# Patient Record
Sex: Female | Born: 1990 | Race: Black or African American | Hispanic: No | Marital: Single | Smoking: Current every day smoker
Health system: Southern US, Community
[De-identification: ages and names within clinical notes are randomized; demographics above are authoritative.]

## PROBLEM LIST (undated history)

## (undated) DIAGNOSIS — D649 Anemia, unspecified: Secondary | ICD-10-CM

## (undated) DIAGNOSIS — N920 Excessive and frequent menstruation with regular cycle: Secondary | ICD-10-CM

## (undated) DIAGNOSIS — D509 Iron deficiency anemia, unspecified: Secondary | ICD-10-CM

## (undated) DIAGNOSIS — E278 Other specified disorders of adrenal gland: Secondary | ICD-10-CM

## (undated) HISTORY — DX: Excessive and frequent menstruation with regular cycle: N92.0

## (undated) HISTORY — DX: Iron deficiency anemia, unspecified: D50.9

---
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
## 2010-05-20 ENCOUNTER — Observation Stay: Payer: Self-pay | Admitting: Obstetrics and Gynecology

## 2010-10-04 ENCOUNTER — Inpatient Hospital Stay: Payer: Self-pay | Admitting: Obstetrics and Gynecology

## 2011-11-20 ENCOUNTER — Emergency Department: Payer: Self-pay | Admitting: *Deleted

## 2016-01-03 ENCOUNTER — Encounter: Payer: Self-pay | Admitting: Emergency Medicine

## 2016-01-03 ENCOUNTER — Emergency Department
Admission: EM | Admit: 2016-01-03 | Discharge: 2016-01-03 | Discharge status: Against Medical Advice | Payer: Self-pay | Attending: Emergency Medicine | Admitting: Emergency Medicine

## 2016-01-03 DIAGNOSIS — Z3202 Encounter for pregnancy test, result negative: Secondary | ICD-10-CM | POA: Insufficient documentation

## 2016-01-03 DIAGNOSIS — F17210 Nicotine dependence, cigarettes, uncomplicated: Secondary | ICD-10-CM | POA: Insufficient documentation

## 2016-01-03 DIAGNOSIS — N939 Abnormal uterine and vaginal bleeding, unspecified: Secondary | ICD-10-CM | POA: Insufficient documentation

## 2016-01-03 DIAGNOSIS — R1031 Right lower quadrant pain: Secondary | ICD-10-CM

## 2016-01-03 LAB — URINALYSIS COMPLETE WITH MICROSCOPIC (ARMC ONLY)
Bacteria, UA: NONE SEEN
Bilirubin Urine: NEGATIVE
GLUCOSE, UA: NEGATIVE mg/dL
Ketones, ur: NEGATIVE mg/dL
Leukocytes, UA: NEGATIVE
Nitrite: NEGATIVE
PH: 5 (ref 5.0–8.0)
Protein, ur: 30 mg/dL — AB
Specific Gravity, Urine: 1.029 (ref 1.005–1.030)

## 2016-01-03 LAB — COMPREHENSIVE METABOLIC PANEL
ALT: 10 U/L — AB (ref 14–54)
AST: 13 U/L — AB (ref 15–41)
Albumin: 4.1 g/dL (ref 3.5–5.0)
Alkaline Phosphatase: 60 U/L (ref 38–126)
Anion gap: 7 (ref 5–15)
BUN: 9 mg/dL (ref 6–20)
CHLORIDE: 105 mmol/L (ref 101–111)
CO2: 23 mmol/L (ref 22–32)
CREATININE: 0.69 mg/dL (ref 0.44–1.00)
Calcium: 9.1 mg/dL (ref 8.9–10.3)
GFR calc Af Amer: >60 mL/min (ref >60)
GLUCOSE: 110 mg/dL — AB (ref 65–99)
Potassium: 3.5 mmol/L (ref 3.5–5.1)
Sodium: 135 mmol/L (ref 135–145)
Total Bilirubin: 0.7 mg/dL (ref 0.3–1.2)
Total Protein: 8.2 g/dL — ABNORMAL HIGH (ref 6.5–8.1)

## 2016-01-03 LAB — CBC
HCT: 31.1 % — ABNORMAL LOW (ref 35.0–47.0)
Hemoglobin: 9.6 g/dL — ABNORMAL LOW (ref 12.0–16.0)
MCH: 22.8 pg — AB (ref 26.0–34.0)
MCHC: 30.9 g/dL — AB (ref 32.0–36.0)
MCV: 73.8 fL — AB (ref 80.0–100.0)
PLATELETS: 267 10*3/uL (ref 150–440)
RBC: 4.21 MIL/uL (ref 3.80–5.20)
RDW: 18.8 % — AB (ref 11.5–14.5)
WBC: 15.7 10*3/uL — AB (ref 3.6–11.0)

## 2016-01-03 LAB — LIPASE, BLOOD: LIPASE: 19 U/L (ref 11–51)

## 2016-01-03 LAB — POCT PREGNANCY, URINE: Preg Test, Ur: NEGATIVE

## 2016-01-03 NOTE — ED Provider Notes (Signed)
[facility] Emergency Department Provider Note   ____________________________________________  Time seen: Approximately 2:45 PM I have reviewed the triage vital signs and the triage nursing note.  HISTORY  Chief Complaint Abdominal Pain and Vaginal Bleeding   Historian Patient  HPI Kari Sweeney is a 25 y.o. female who is here for complaint of heavy and irregular vaginal bleeding. States that she bled in the last month for 11 days which was heavy and had a lot of cramping, and then was off for about 2 weeks, and she started demonstrating again 2 days ago. Heavy vaginal bleeding. This occasional dizziness at times. Low abdominal cramping at times. No back pain. Not on any hormonal birth control. She's been having some one-sided pain in the right-sided abdomen which she points right to the middle or upper area. Denies urinary symptoms. Denies back pain. No fever. No coughing. No chest pain or trouble breathing.    History reviewed. No pertinent past medical history.  There are no active problems to display for this patient.   History reviewed. No pertinent past surgical history.  No current outpatient prescriptions on file.  Allergies Review of patient's allergies indicates no known allergies.  No family history on file.  Social History Social History  Substance Use Topics  . Smoking status: Current Some Day Smoker -- 0.50 packs/day    Types: Cigarettes  . Smokeless tobacco: Never Used  . Alcohol Use: 0.6 oz/week    1 Cans of beer per week     Comment: occas.     Review of Systems  Constitutional: Negative for fever. Eyes: Negative for visual changes. ENT: Negative for sore throat. Cardiovascular: Negative for chest pain. Respiratory: Negative for shortness of breath. Gastrointestinal: Negative for vomiting and diarrhea. Genitourinary: Negative for dysuria. Musculoskeletal: Negative for back pain. Skin: Negative for rash. Neurological:  Negative for headache. 10 point Review of Systems otherwise negative ____________________________________________   PHYSICAL EXAM:  VITAL SIGNS: ED Triage Vitals  Enc Vitals Group     BP 01/03/16 1229 122/63 mmHg     Pulse Rate 01/03/16 1229 90     Resp 01/03/16 1229 16     Temp 01/03/16 1229 98.7 F (37.1 C)     Temp Source 01/03/16 1229 Oral     SpO2 01/03/16 1229 99 %     Weight 01/03/16 1229 240 lb (108.863 kg)     Height 01/03/16 1229  (1.702 m)     Head Cir --      Peak Flow --      Pain Score 01/03/16 1234 10     Pain Loc --      Pain Edu? --      Excl. in GC? --      Constitutional: Alert and oriented. Well appearing and in no distress. Sitting in the chair in full close. Eyes: Conjunctivae are normal. PERRL. Normal extraocular movements. ENT   Head: Normocephalic and atraumatic.   Nose: No congestion/rhinnorhea.   Mouth/Throat: Mucous membranes are moist.   Neck: No stridor. Cardiovascular/Chest: Normal cap refill. Respiratory: Normal respiratory effort without tachypnea nor retractions.  Gastrointestinal: Soft. No distention, no guarding, no rebound.  Mild tenderness in the right side of the abdomen especially in the pannus. Nontender to point tenderness in the right lower quadrant underneath the pannus.  Genitourinary/rectal:Deferred Musculoskeletal: Nontender with normal range of motion in all extremities. No joint effusions.  No lower extremity tenderness.  No edema. Neurologic:  Normal speech and language. No gross  or focal neurologic deficits are appreciated. Skin:  Skin is warm, dry and intact. No rash noted. Psychiatric: Mood and affect are normal. Speech and behavior are normal. Patient exhibits appropriate insight and judgment.  ____________________________________________   EKG I, Governor Rooks, MD, the attending physician have personally viewed and interpreted all ECGs.  None ____________________________________________  LABS  (pertinent positives/negatives)  Urine pregnancy test negative Lipase 19 Comprehensive metabolic panel without significant abnormalities. LFTs not elevated White blood count 15.7, hemoglobin 9.6 and platelet count 267 Urinalysis 6-30 red blood cells, otherwise negative for signs of infection  ____________________________________________  RADIOLOGY All Xrays were viewed by me. Imaging interpreted by Radiologist.  None __________________________________________  PROCEDURES  Procedure(s) performed: None  Critical Care performed: None  ____________________________________________   ED COURSE / ASSESSMENT AND PLAN  Pertinent labs & imaging results that were available during my care of the patient were reviewed by me and considered in my medical decision making (see chart for details).    Patient is here mainly for irregular vaginal bleeding and we discussed that her hemoglobin is stable as well as her vital signs. She's also been complaining of some right-sided discomfort for what sounds like probably 2 weeks now. She does report some vaginal discharge, but now it's more vaginal bleeding and discharge. Because of her elevated white blood cell count, I did recommend vaginal/pelvic exam and the possibility of CT scan of the abdomen and pelvis plus or minus ultrasound of the pelvis.  Patient states that she does not want to stay for any further evaluation including does not want to do a pelvic exam or any imaging. She states she wants to get home to her kids.  We did discuss the risk of possible missed infection, infertility, missed appendicitis which could lead to sepsis, rupture, or even death.  She is alert and oriented and understands risks versus benefit. She is able to return to the ED if she has any worsening symptoms.    CONSULTATIONS:   None   Patient / Family / Caregiver informed of clinical course, medical decision-making process, and agree with plan.   I discussed  return precautions, follow-up instructions, and discharged instructions with patient and/or family.   ___________________________________________   FINAL CLINICAL IMPRESSION(S) / ED DIAGNOSES   Final diagnoses:  Episode of heavy vaginal bleeding  Abdominal pain of unknown cause, right              Note: This dictation was prepared with Dragon dictation. Any transcriptional errors that result from this process are unintentional   Governor Rooks, MD 01/03/16 1501

## 2016-01-03 NOTE — ED Notes (Signed)
Per Dr. Shaune Pollack, patient did not want to wait for follow up testing on elevated white count and right side pain.  Pt signed out AMA. Explained risk of leaving and follow up information as outpatient. Signed AMA paper.

## 2016-01-03 NOTE — Discharge Instructions (Signed)
You were evaluated for having irregular vaginal bleeding and right-sided abdominal pain, and were found to have elevated white blood cell count.  I discussed with you that your elevated white blood cell count and right-sided abdominal pain could indicate intra-abdominal emergency such as appendicitis, or vaginal infection, or other serious condition. You chose not to state in the ER for any further evaluation.  We discussed, return to the emergency room immediately if you want to return for additional evaluation, or any worsening symptoms including worsening pain, fever, black or bloody stools, dizziness or passing out, worsening vaginal bleeding, vaginal discharge, or any other symptoms concerning to you.   Abdominal Pain, Adult Many things can cause abdominal pain. Usually, abdominal pain is not caused by a disease and will improve without treatment. It can often be observed and treated at home. Your health care provider will do a physical exam and possibly order blood tests and X-rays to help determine the seriousness of your pain. However, in many cases, more time must pass before a clear cause of the pain can be found. Before that point, your health care provider may not know if you need more testing or further treatment. HOME CARE INSTRUCTIONS Monitor your abdominal pain for any changes. The following actions may help to alleviate any discomfort you are experiencing:  Only take over-the-counter or prescription medicines as directed by your health care provider.  Do not take laxatives unless directed to do so by your health care provider.  Try a clear liquid diet (broth, tea, or water) as directed by your health care provider. Slowly move to a bland diet as tolerated. SEEK MEDICAL CARE IF:  You have unexplained abdominal pain.  You have abdominal pain associated with nausea or diarrhea.  You have pain when you urinate or have a bowel movement.  You experience abdominal pain that wakes you  in the night.  You have abdominal pain that is worsened or improved by eating food.  You have abdominal pain that is worsened with eating fatty foods.  You have a fever. SEEK IMMEDIATE MEDICAL CARE IF:  Your pain does not go away within 2 hours.  You keep throwing up (vomiting).  Your pain is felt only in portions of the abdomen, such as the right side or the left lower portion of the abdomen.  You pass bloody or black tarry stools. MAKE SURE YOU:  Understand these instructions.  Will watch your condition.  Will get help right away if you are not doing well or get worse.   This information is not intended to replace advice given to you by your health care provider. Make sure you discuss any questions you have with your health care provider.   Document Released: 09/01/2005 Document Revised: 08/13/2015 Document Reviewed: 08/01/2013 Elsevier Interactive Patient Education 2016 [employer]  Abnormal Uterine Bleeding Abnormal uterine bleeding means bleeding from the vagina that is not your normal menstrual period. This can be:  Bleeding or spotting between periods.  Bleeding after sex (sexual intercourse).  Bleeding that is heavier or more than normal.  Periods that last longer than usual.  Bleeding after menopause. There are many problems that may cause this. Treatment will depend on the cause of the bleeding. Any kind of bleeding that is not normal should be reviewed by your doctor.  HOME CARE Watch your condition for any changes. These actions may lessen any discomfort you are having:  Do not use tampons or douches as told by your doctor.  Change your  pads often. You should get regular pelvic exams and Pap tests. Keep all appointments for tests as told by your doctor. GET HELP IF:  You are bleeding for more than 1 week.  You feel dizzy at times. GET HELP RIGHT AWAY IF:   You pass out.  You have to change pads every 15 to 30 minutes.  You have belly  pain.  You have a fever.  You become sweaty or weak.  You are passing large blood clots from the vagina.  You feel sick to your stomach (nauseous) and throw up (vomit). MAKE SURE YOU:  Understand these instructions.  Will watch your condition.  Will get help right away if you are not doing well or get worse.   This information is not intended to replace advice given to you by your health care provider. Make sure you discuss any questions you have with your health care provider.   Document Released: 09/19/2009 Document Revised: 11/27/2013 Document Reviewed: 06/21/2013 Elsevier Interactive Patient Education [employer]

## 2016-01-03 NOTE — ED Notes (Signed)
Pt reports R upper and lower abd pain that has been going on for past month. Pt states her period came on the 11th and then bleed for about 11 days. Then period restarted on the 26th and she is currently menstruating now. Pt also reports dizziness, cold chills. Denies fever. Not currently on birth control for past 8 years

## 2016-01-05 ENCOUNTER — Encounter: Payer: Self-pay | Admitting: Emergency Medicine

## 2016-01-05 ENCOUNTER — Emergency Department
Admission: EM | Admit: 2016-01-05 | Discharge: 2016-01-05 | Disposition: A | Discharge status: Home / Self Care | Payer: Self-pay | Attending: Emergency Medicine | Admitting: Emergency Medicine

## 2016-01-05 ENCOUNTER — Emergency Department: Payer: Self-pay

## 2016-01-05 DIAGNOSIS — R109 Unspecified abdominal pain: Secondary | ICD-10-CM

## 2016-01-05 DIAGNOSIS — N281 Cyst of kidney, acquired: Secondary | ICD-10-CM | POA: Insufficient documentation

## 2016-01-05 DIAGNOSIS — F17210 Nicotine dependence, cigarettes, uncomplicated: Secondary | ICD-10-CM | POA: Insufficient documentation

## 2016-01-05 DIAGNOSIS — Z3202 Encounter for pregnancy test, result negative: Secondary | ICD-10-CM | POA: Insufficient documentation

## 2016-01-05 LAB — LIPASE, BLOOD: LIPASE: 21 U/L (ref 11–51)

## 2016-01-05 LAB — URINALYSIS COMPLETE WITH MICROSCOPIC (ARMC ONLY)
Bilirubin Urine: NEGATIVE
Glucose, UA: NEGATIVE mg/dL
Ketones, ur: NEGATIVE mg/dL
LEUKOCYTES UA: NEGATIVE
NITRITE: NEGATIVE
PH: 7 (ref 5.0–8.0)
PROTEIN: NEGATIVE mg/dL
SPECIFIC GRAVITY, URINE: 1.027 (ref 1.005–1.030)

## 2016-01-05 LAB — COMPREHENSIVE METABOLIC PANEL
ALBUMIN: 3.6 g/dL (ref 3.5–5.0)
ALT: 13 U/L — ABNORMAL LOW (ref 14–54)
ANION GAP: 6 (ref 5–15)
AST: 18 U/L (ref 15–41)
Alkaline Phosphatase: 62 U/L (ref 38–126)
BILIRUBIN TOTAL: 0.4 mg/dL (ref 0.3–1.2)
BUN: 11 mg/dL (ref 6–20)
CHLORIDE: 106 mmol/L (ref 101–111)
CO2: 25 mmol/L (ref 22–32)
Calcium: 9.1 mg/dL (ref 8.9–10.3)
Creatinine, Ser: 0.68 mg/dL (ref 0.44–1.00)
GFR calc Af Amer: >60 mL/min (ref >60)
GLUCOSE: 92 mg/dL (ref 65–99)
POTASSIUM: 3.6 mmol/L (ref 3.5–5.1)
Sodium: 137 mmol/L (ref 135–145)
TOTAL PROTEIN: 7.6 g/dL (ref 6.5–8.1)

## 2016-01-05 LAB — CBC
HEMATOCRIT: 29.7 % — AB (ref 35.0–47.0)
HEMOGLOBIN: 9.1 g/dL — AB (ref 12.0–16.0)
MCH: 23 pg — ABNORMAL LOW (ref 26.0–34.0)
MCHC: 30.6 g/dL — ABNORMAL LOW (ref 32.0–36.0)
MCV: 75 fL — AB (ref 80.0–100.0)
Platelets: 250 10*3/uL (ref 150–440)
RBC: 3.96 MIL/uL (ref 3.80–5.20)
RDW: 18.4 % — ABNORMAL HIGH (ref 11.5–14.5)
WBC: 11.3 10*3/uL — AB (ref 3.6–11.0)

## 2016-01-05 LAB — POCT PREGNANCY, URINE: PREG TEST UR: NEGATIVE

## 2016-01-05 NOTE — ED Notes (Signed)
Lab called regarding urine culture add on, will add on at this time  

## 2016-01-05 NOTE — ED Notes (Signed)
Pain to right upper and lower abdomen as well as right back.  Was seen over weekend but left AMA.  Denies NVD.  Gets dizzy and lightheaded.

## 2016-01-05 NOTE — ED Provider Notes (Signed)
[facility] Emergency Department Provider Note  ____________________________________________    I have reviewed the triage vital signs and the nursing notes.   HISTORY  Chief Complaint Abdominal Pain    HPI Kari Sweeney is a 25 y.o. female who presents with complaints of right flank pain. She reports this pain is been intermittent in the last 2-3 days. She was in the emergency department over the weekend and left AGAINST MEDICAL ADVICE prior to any studies for her flank pain. She has had vaginal bleeding for the last 2 weeks and is concerned she may be pregnant. She denies a history of kidney stones. No abdominal surgeries.     History reviewed. No pertinent past medical history.  There are no active problems to display for this patient.   History reviewed. No pertinent past surgical history.  No current outpatient prescriptions on file.  Allergies Review of patient's allergies indicates no known allergies.  History reviewed. No pertinent family history.  Social History Social History  Substance Use Topics  . Smoking status: Current Some Day Smoker -- 0.50 packs/day    Types: Cigarettes  . Smokeless tobacco: Never Used  . Alcohol Use: 0.6 oz/week    1 Cans of beer per week     Comment: occas.     Review of Systems  Constitutional: Negative for fever. Eyes: Negative for visual changes. ENT: Negative for sore throat Cardiovascular: Negative for chest pain. Respiratory: Negative for shortness of breath. Gastrointestinal: Negative for  vomiting and diarrhea. Genitourinary: Negative for dysuria. Musculoskeletal: Negative for back pain. Skin: Negative for rash. Neurological: Negative for headaches  Psychiatric: No anxiety    ____________________________________________   PHYSICAL EXAM:  VITAL SIGNS: ED Triage Vitals  Enc Vitals Group     BP 01/05/16 1129 160/85 mmHg     Pulse Rate 01/05/16 1129 95     Resp 01/05/16 1129 18      Temp 01/05/16 1129 98.6 F (37 C)     Temp Source 01/05/16 1129 Oral     SpO2 01/05/16 1129 98 %     Weight 01/05/16 1129 235 lb (106.595 kg)     Height 01/05/16 1129  (1.702 m)     Head Cir --      Peak Flow --      Pain Score 01/05/16 1130 10     Pain Loc --      Pain Edu? --      Excl. in GC? --      Constitutional: Alert and oriented. Well appearing and in no distress. Eyes: Conjunctivae are normal.  ENT   Head: Normocephalic and atraumatic.   Mouth/Throat: Mucous membranes are moist. Cardiovascular: Normal rate, regular rhythm. Normal and symmetric distal pulses are present in all extremities. No murmurs, rubs, or gallops. Respiratory: Normal respiratory effort without tachypnea nor retractions. Breath sounds are clear and equal bilaterally.  Gastrointestinal: Soft and non-tender in all quadrants. No distention. There is no CVA tenderness. Genitourinary: deferred Musculoskeletal: Nontender with normal range of motion in all extremities. No lower extremity tenderness nor edema. Neurologic:  Normal speech and language. No gross focal neurologic deficits are appreciated. Skin:  Skin is warm, dry and intact. No rash noted. Psychiatric: Mood and affect are normal. Patient exhibits appropriate insight and judgment.  ____________________________________________    LABS (pertinent positives/negatives)  Labs Reviewed  COMPREHENSIVE METABOLIC PANEL - Abnormal; Notable for the following:    ALT 13 (*)    All other components within normal limits  CBC - Abnormal; Notable for the following:    WBC 11.3 (*)    Hemoglobin 9.1 (*)    HCT 29.7 (*)    MCV 75.0 (*)    MCH 23.0 (*)    MCHC 30.6 (*)    RDW 18.4 (*)    All other components within normal limits  URINALYSIS COMPLETEWITH MICROSCOPIC ([facility] ONLY) - Abnormal; Notable for the following:    Color, Urine YELLOW (*)    APPearance HAZY (*)    Hgb urine dipstick 1+ (*)    Bacteria, UA RARE (*)    Squamous  Epithelial / LPF 6-30 (*)    All other components within normal limits  LIPASE, BLOOD  POC URINE PREG, ED  POCT PREGNANCY, URINE    ____________________________________________   EKG  ED ECG REPORT I, Jene Every, the attending physician, personally viewed and interpreted this ECG.  Date: 01/05/2016 EKG Time: 11:34 AM Rate: 96 Rhythm: normal sinus rhythm QRS Axis: normal Intervals: normal ST/T Wave abnormalities: normal Conduction Disturbances: none Narrative Interpretation: unremarkable   ____________________________________________    RADIOLOGY I have personally reviewed any xrays that were ordered on this patient: CT scan shows right cystic structure on kidney  ____________________________________________   PROCEDURES  Procedure(s) performed: none  Critical Care performed: none  ____________________________________________   INITIAL IMPRESSION / ASSESSMENT AND PLAN / ED COURSE  Pertinent labs & imaging results that were available during my care of the patient were reviewed by me and considered in my medical decision making (see chart for details).  Patient overall well-appearing and in no distress. Her exam is benign. Her vitals are unremarkable aside some mild hypertension. She is mildly anemic but no significant change from prior labs taken. Her white count is actually improved since last time she was here. Given her flank pain or order CT renal stone study  CT shows no acute distress although cyst is noted in the right kidney which I discussed with the patient and explained to her that she needs to have this followed up as soon as possible with urology. She agrees to do so. She is comfortable and no pain currently hence I will discharge her with urology follow-up as discussed  ____________________________________________   FINAL CLINICAL IMPRESSION(S) / ED DIAGNOSES  Final diagnoses:  Flank pain, acute  Renal cyst     Jene Every,  MD 01/05/16 1630

## 2016-01-05 NOTE — Discharge Instructions (Signed)
Renal Mass A renal mass is a growth in the kidney. Some masses are harmful and may cause cancer. Others are harmless. A renal mass may be solid or filled with fluid. Those that are filled with fluid are called cysts. CAUSES Usually, the cause of a renal mass is unknown. However, certain types of cancers and infections can cause a renal mass.  SIGNS AND SYMPTOMS Symptoms may include:  Blood in the urine.  Pain in the side or back (flank pain).  Feeling full soon after eating.  Weight loss.  Swelling in the abdomen. Some renal masses do not cause symptoms. DIAGNOSIS A renal mass may be found with a CT scan, ultrasound, or MRI of your abdomen.  TREATMENT Treatment will depend on the type of renal mass.  If the renal mass is a cyst that is not causing problems, you will not need treatment.  If the renal mass is a cyst that is causing problems, it may need to be drained during a type of surgery called laparoscopic surgery.  If the renal mass is solid, it may need to be removed with a surgery to your abdomen.  If the renal mass is caused by kidney cancer, you may need surgery to remove all or part of your kidney. You may need to see your health care provider once or twice a year to have CT scans and ultrasounds done. Having these tests will allow your health care provider to see if your renal mass has changed or gotten bigger. HOME CARE INSTRUCTIONS What you need to do at home will depend on the type of renal mass that you have. The treatment you had also will make a difference. Follow the instructions your health care provider gives you. In general:  Keep all follow-up visits as directed by your health care provider.  Take medicines only as directed by your health care provider. SEEK MEDICAL CARE IF:  You have abdominal pain.  You have flank pain.  You have a fever. SEEK IMMEDIATE MEDICAL CARE IF:   Your pain gets worse.  There is blood in your urine.  You cannot  urinate.  You have chest pain.  You have trouble breathing. MAKE SURE YOU:  Understand these instructions.  Will watch your condition.  Will get help right away if you are not doing well or get worse.   This information is not intended to replace advice given to you by your health care provider. Make sure you discuss any questions you have with your health care provider.   Document Released: 06/19/2014 Document Reviewed: 06/19/2014 Elsevier Interactive Patient Education 2016 Elsevier Inc.  

## 2016-01-05 NOTE — ED Notes (Signed)
MD Kinner at bedside  

## 2016-01-05 NOTE — ED Notes (Signed)
Pt is currently on period.  She had bleeding beginning January for 11 days stopped and then started again and has been bleeding for last 4 days. Pt unsure if pregnant.

## 2016-01-08 LAB — URINE CULTURE

## 2016-06-13 IMAGING — CT CT RENAL STONE PROTOCOL
3 of 4 series · 9 of 46 positions shown, 16 images · non-contrast
Comparison: None.

CLINICAL DATA: Right flank pain x 1.5 weeks, hematuria, hx of
c-section x 2

EXAM:
CT ABDOMEN AND PELVIS WITHOUT CONTRAST
TECHNIQUE: Multidetector CT imaging of the abdomen and pelvis was performed
following the standard protocol without IV contrast.

[Series 4: lung · axial · 0.78mm/px · z∈[-165,-80]mm · 5 of 27 slices shown, 10 images]
[im 5/27  soft-tissue]
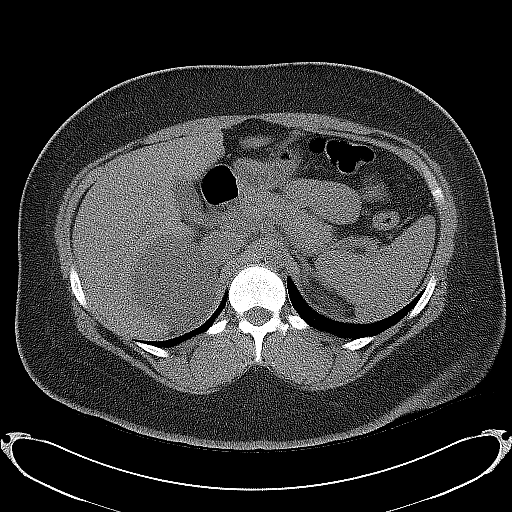
[im 5/27  bone]
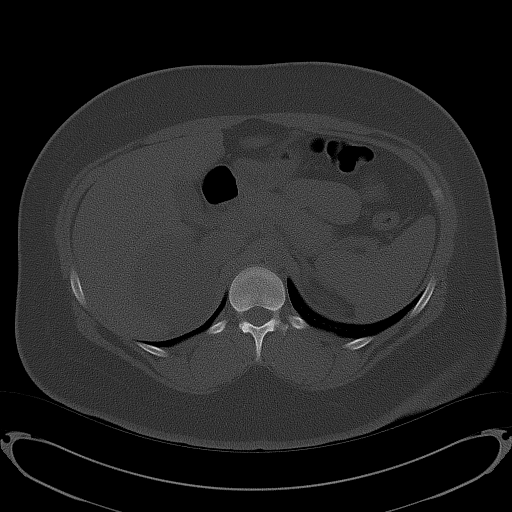
[im 9/27  soft-tissue]
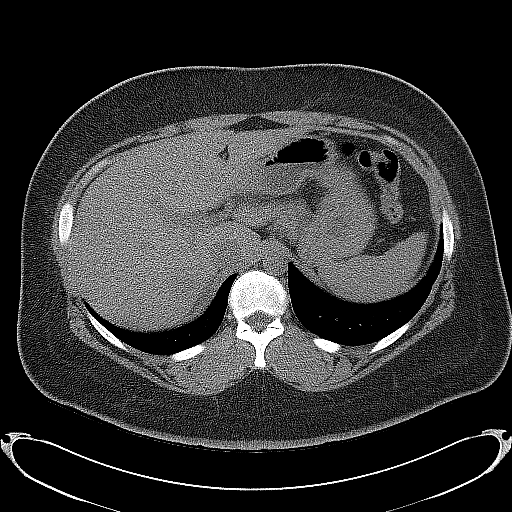
[im 9/27  lung]
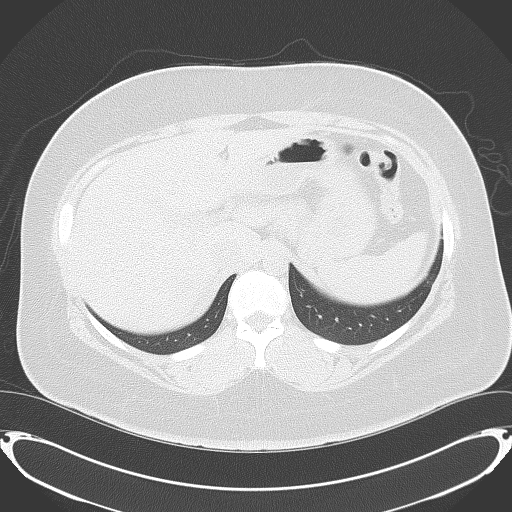
[im 14/27  soft-tissue]
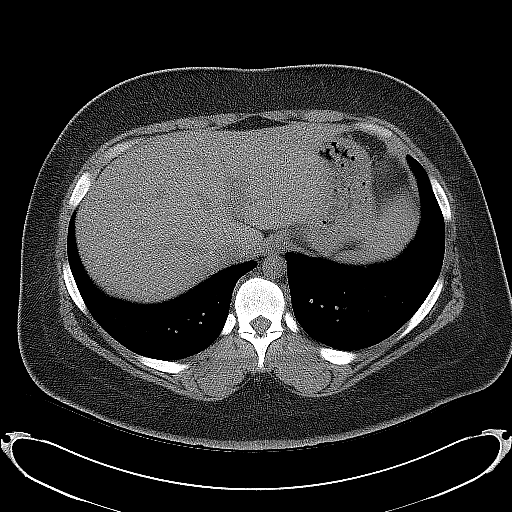
[im 14/27  lung]
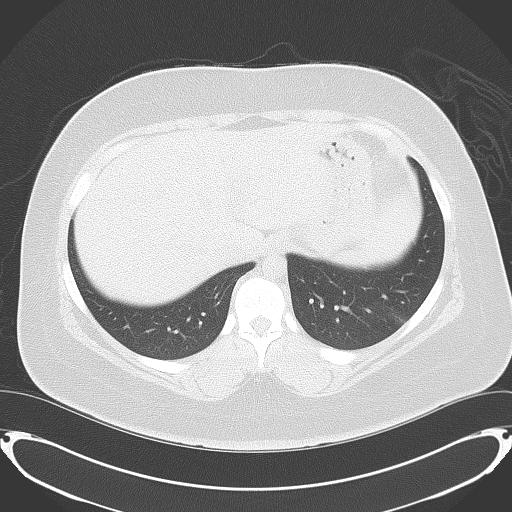
[im 18/27  soft-tissue]
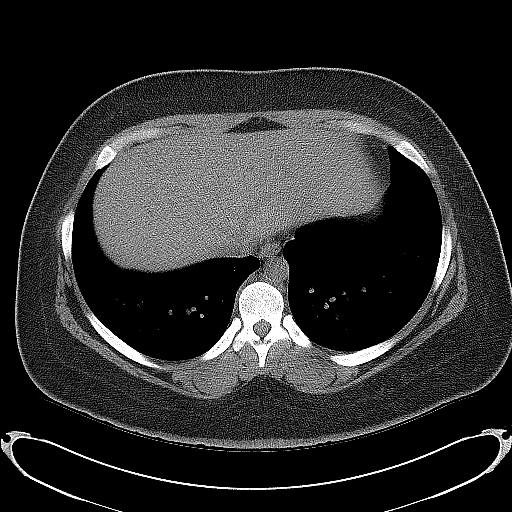
[im 18/27  lung]
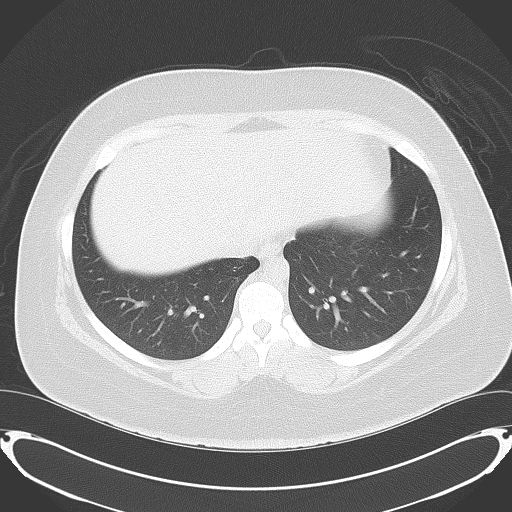
[im 22/27  soft-tissue]
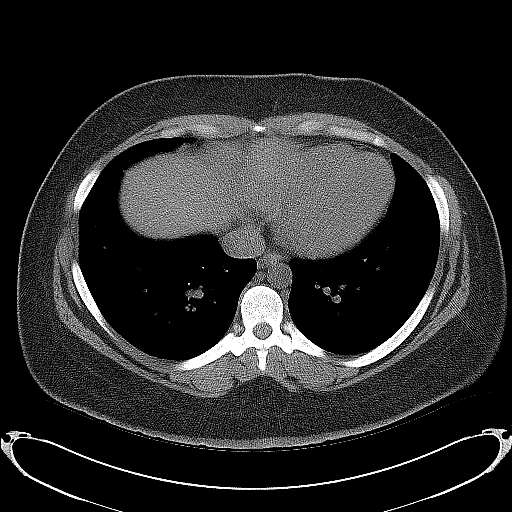
[im 22/27  lung]
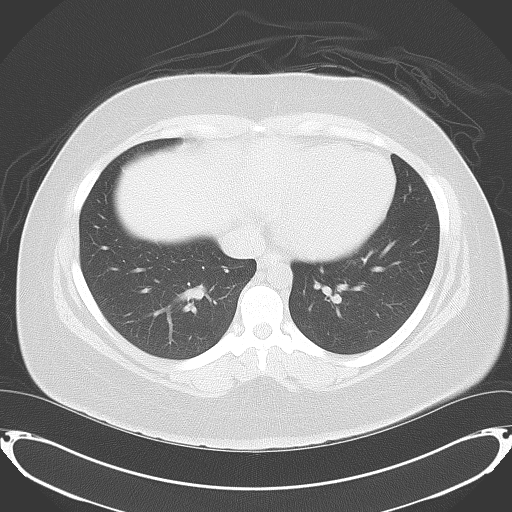

[Series 5: coronal · coronal · 0.74mm/px · 3 of 149 slices shown, 4 images]
[im 50/149  soft-tissue]
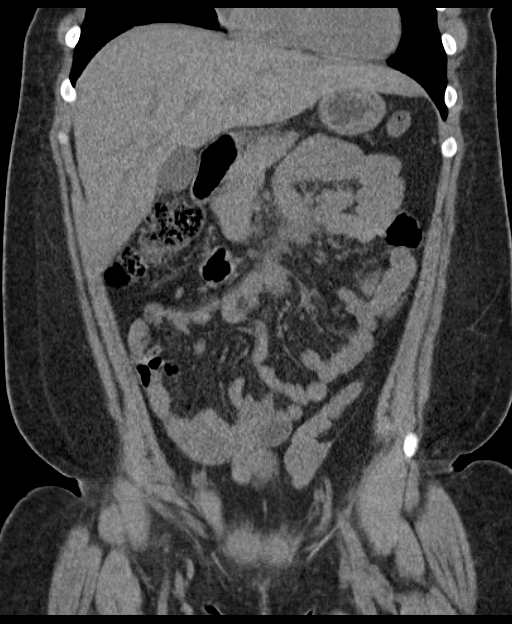
[im 66/149  soft-tissue]
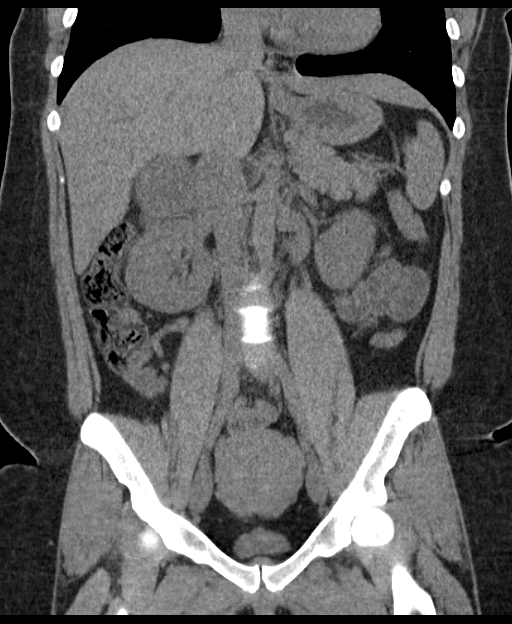
[im 66/149  bone]
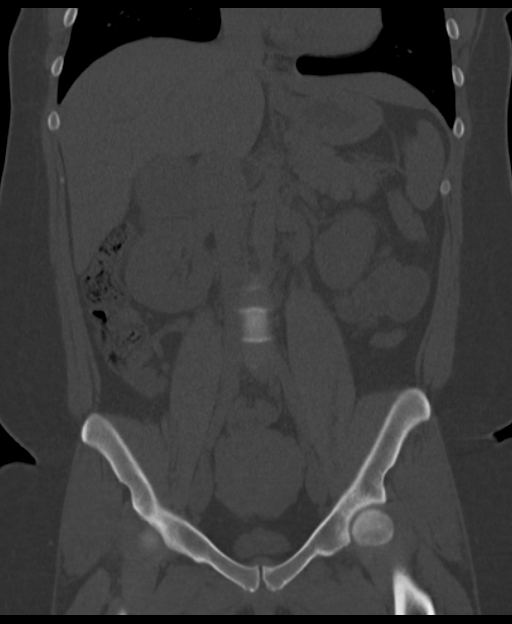
[im 83/149  soft-tissue]
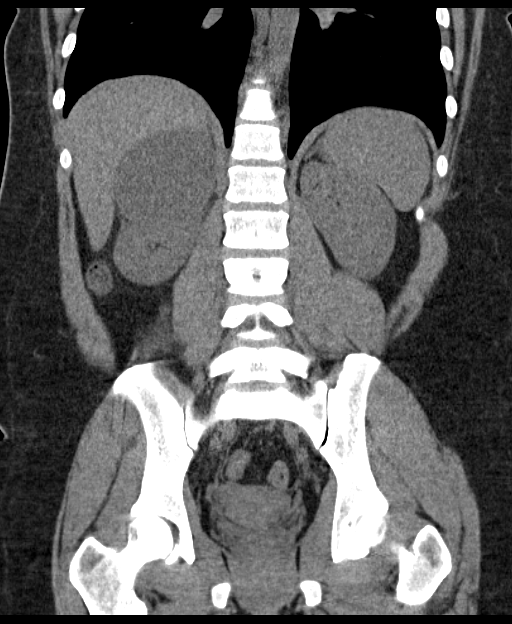

[Series 6: sagittal · sagittal · 0.57mm/px · 1 of 195 slices shown, 2 images]
[im 65/195  soft-tissue]
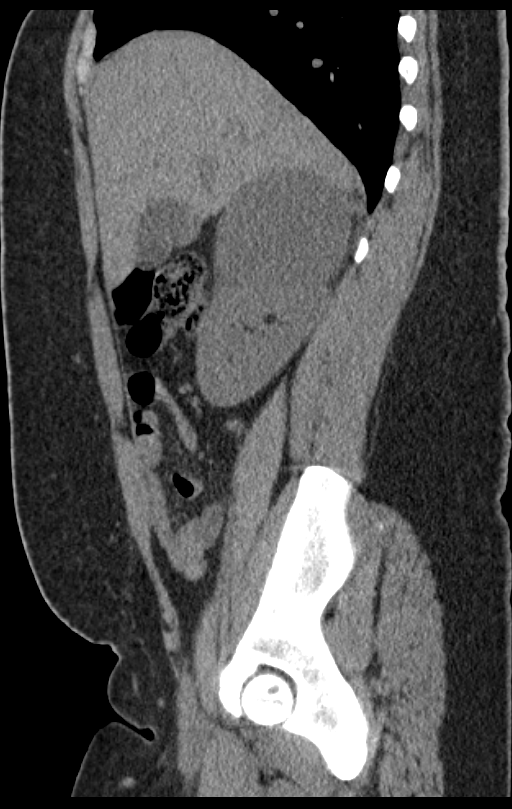
[im 65/195  bone]
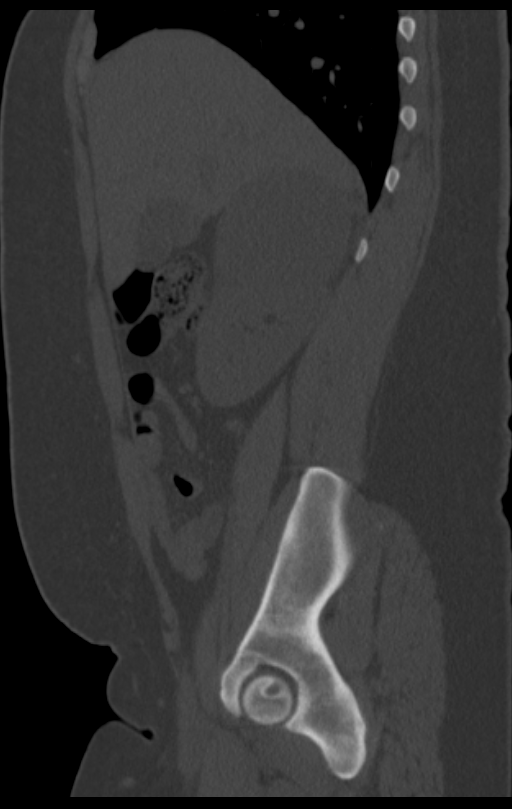

[9 of 46 positions shown; findings below may reference images not displayed]

FINDINGS: Lower chest:  Normal

Hepatobiliary: Normal

Pancreas: Normal

Spleen: Normal

Adrenals/Urinary Tract: There is an 8 cm right upper pole renal
lesion with average attenuation value of between 11 and 13. No
calculi are seen on either kidney. There is mild perinephric
inflammation on the right.

Stomach/Bowel: Normal

Vascular/Lymphatic: Normal

Reproductive: Normal

Other: No ascites

Musculoskeletal: No acute findings
IMPRESSION: Large cystic lesion right kidney not fully characterized without
contrast possibly a large simple cyst. However, there is also
perinephric inflammation on the right suggesting the possibility of
pyelonephritis. There is no stone or evidence of hydronephrosis.

## 2016-07-10 ENCOUNTER — Emergency Department
Admission: EM | Admit: 2016-07-10 | Discharge: 2016-07-10 | Disposition: A | Discharge status: Home / Self Care | Payer: Medicaid Other | Attending: Emergency Medicine | Admitting: Emergency Medicine

## 2016-07-10 ENCOUNTER — Encounter: Payer: Self-pay | Admitting: Emergency Medicine

## 2016-07-10 ENCOUNTER — Emergency Department: Payer: Medicaid Other

## 2016-07-10 DIAGNOSIS — Y990 Civilian activity done for income or pay: Secondary | ICD-10-CM | POA: Diagnosis not present

## 2016-07-10 DIAGNOSIS — M79602 Pain in left arm: Secondary | ICD-10-CM | POA: Insufficient documentation

## 2016-07-10 DIAGNOSIS — Y9389 Activity, other specified: Secondary | ICD-10-CM | POA: Insufficient documentation

## 2016-07-10 DIAGNOSIS — X500XXA Overexertion from strenuous movement or load, initial encounter: Secondary | ICD-10-CM | POA: Diagnosis not present

## 2016-07-10 DIAGNOSIS — F17210 Nicotine dependence, cigarettes, uncomplicated: Secondary | ICD-10-CM | POA: Insufficient documentation

## 2016-07-10 DIAGNOSIS — Y929 Unspecified place or not applicable: Secondary | ICD-10-CM | POA: Insufficient documentation

## 2016-07-10 HISTORY — DX: Anemia, unspecified: D64.9

## 2016-07-10 MED ORDER — IBUPROFEN 600 MG PO TABS
600.0000 mg | ORAL_TABLET | Freq: Once | ORAL | Status: AC
Start: 2016-07-10 — End: 2016-07-10
  Administered 2016-07-10: 600 mg via ORAL
  Filled 2016-07-10: qty 1

## 2016-07-10 MED ORDER — ETODOLAC 400 MG PO TABS: 400.0000 mg | ORAL_TABLET | Freq: Two times a day (BID) | ORAL | 0 refills | Status: DC

## 2016-07-10 NOTE — ED Notes (Signed)
Pt. Denies injury to left arm but states intermittent pain that is not directly effected by activity. 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 Pt. Reports a numbness and tingling intermittent with pain. No edema, heat, or discoloration present in affected extremity. Pt. Reports home tx. With 2 advil today, unsure of the dosage.

## 2016-07-10 NOTE — Discharge Instructions (Signed)
Follow-up with Dr. Joice Lofts if any continued problems with your shoulder. Begin taking etodolac 400 mg twice a day with food.

## 2016-07-10 NOTE — ED Triage Notes (Signed)
L arm pain x 1 month, hurts worse with movement, denies injury.

## 2016-07-10 NOTE — ED Provider Notes (Signed)
[facility] Emergency Department Provider Note   ____________________________________________   First MD Initiated Contact with Patient 07/10/16 1537     (approximate)  I have reviewed the triage vital signs and the nursing notes.   HISTORY  Chief Complaint Arm Pain   HPI Kari Sweeney is a 25 y.o. female is here with complaint of left arm pain for 1 1/2  months. Patient denies any recent injury to her arm and states that pain is getting worse. She states that occasionally the pain goes from her shoulder down into her hand and then she gets some numbness in her fingers. She is not taking any over-the-counter medication and has not seen anyone prior to today's visit. 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 She is unaware of any injury in the past and denies any neck injuries. Currently she denies any neck pain. Pain is increased with abduction of her left arm. Currently she rates her pain as a 10 out of 10.   Past Medical History:  Diagnosis Date  . Anemia     There are no active problems to display for this patient.   Past Surgical History:  Procedure Laterality Date  . CESAREAN SECTION      Prior to Admission medications   Medication Sig Start Date End Date Taking? Authorizing Provider  etodolac (LODINE) 400 MG tablet Take 1 tablet (400 mg total) by mouth 2 (two) times daily. 07/10/16   Tommi Rumps, PA-C    Allergies Review of patient's allergies indicates no known allergies.  No family history on file.  Social History Social History  Substance Use Topics  . Smoking status: Current Some Day Smoker    Packs/day: 0.25    Types: Cigarettes  . Smokeless tobacco: Never Used  . Alcohol use 0.6 oz/week    1 Cans of beer per week     Comment: occas.     Review of Systems Constitutional: No fever/chills Cardiovascular: Denies chest pain. Respiratory: Denies shortness of breath. Gastrointestinal:    No nausea, no vomiting.   Genitourinary: Menses now Musculoskeletal: Left arm pain positive. Skin: Negative for rash. Neurological: Negative for headaches, focal weakness or numbness.  10-point ROS otherwise negative.  ____________________________________________   PHYSICAL EXAM:  VITAL SIGNS: ED Triage Vitals  Enc Vitals Group     BP 07/10/16 1515 132/73     Pulse Rate 07/10/16 1515 89     Resp 07/10/16 1515 18     Temp 07/10/16 1515 98.2 F (36.8 C)     Temp Source 07/10/16 1515 Oral     SpO2 07/10/16 1515 96 %     Weight 07/10/16 1515 245 lb (111.1 kg)     Height 07/10/16 1515  (1.676 m)     Head Circumference --      Peak Flow --      Pain Score 07/10/16 1516 10     Pain Loc --      Pain Edu? --      Excl. in GC? --     Constitutional: Alert and oriented. Well appearing and in no acute distress. Eyes: Conjunctivae are normal. PERRL. EOMI. Head: Atraumatic. Nose: No congestion/rhinnorhea. Neck: No stridor.  No cervical tenderness on palpation posteriorly. Range of motion is normal in all 4 planes without any difficulty and without assistance. Hematological/Lymphatic/Immunilogical: No cervical lymphadenopathy. Cardiovascular: Normal rate, regular rhythm. Grossly normal heart sounds.  Good peripheral circulation. Respiratory: Normal  respiratory effort.  No retractions. Lungs CTAB. Musculoskeletal: Examination of left arm there is no gross deformity. There is no point tenderness on palpation of the arm. Range of motion is minimally restricted secondary to discomfort. There is no crepitus present on evaluation of the shoulder. Patient is able to extend her arm above her head without any difficulty. There is no edema noted in the upper extremity.  Neurologic:  Normal speech and language. No gross focal neurologic deficits are appreciated. No gait instability. Skin:  Skin is warm, dry and intact. No rash noted. Psychiatric: Mood and affect are normal. Speech and behavior  are normal.  ____________________________________________   LABS (all labs ordered are listed, but only abnormal results are displayed)  Labs Reviewed - No data to display   RADIOLOGY  X-ray of left shoulder was negative per radiologist. I, Tommi Rumps, personally viewed and evaluated these images (plain radiographs) as part of my medical decision making, as well as reviewing the written report by the radiologist. ____________________________________________   PROCEDURES  Procedure(s) performed: None  Procedures  Critical Care performed: No  ____________________________________________   INITIAL IMPRESSION / ASSESSMENT AND PLAN / ED COURSE  Pertinent labs & imaging results that were available during my care of the patient were reviewed by me and considered in my medical decision making (see chart for details).    Clinical Course   Patient was given ibuprofen while in the emergency room. She is reassured that her x-ray was negative. She was given a prescription for etodolac 400 mg twice a day with food. Patient requested a work excuse if she is not getting towards today. She is to follow-up with Dr. Joice Lofts if any continued problems with her shoulder.  ____________________________________________   FINAL CLINICAL IMPRESSION(S) / ED DIAGNOSES  Final diagnoses:  Left arm pain      NEW MEDICATIONS STARTED DURING THIS VISIT:  Discharge Medication List as of 07/10/2016  5:18 PM    START taking these medications   Details  etodolac (LODINE) 400 MG tablet Take 1 tablet (400 mg total) by mouth 2 (two) times daily., Starting Sat 07/10/2016, Print         Note:  This document was prepared using Dragon voice recognition software and may include unintentional dictation errors.    Tommi Rumps, PA-C 07/10/16 1819    Gayla Doss, MD 07/11/16 [phone]

## 2017-02-02 ENCOUNTER — Emergency Department: Payer: Medicaid Other

## 2017-02-02 ENCOUNTER — Emergency Department
Admission: EM | Admit: 2017-02-02 | Discharge: 2017-02-02 | Disposition: A | Discharge status: Home / Self Care | Payer: Medicaid Other | Attending: Emergency Medicine | Admitting: Emergency Medicine

## 2017-02-02 DIAGNOSIS — M549 Dorsalgia, unspecified: Secondary | ICD-10-CM | POA: Insufficient documentation

## 2017-02-02 DIAGNOSIS — N281 Cyst of kidney, acquired: Secondary | ICD-10-CM | POA: Insufficient documentation

## 2017-02-02 DIAGNOSIS — R109 Unspecified abdominal pain: Secondary | ICD-10-CM | POA: Insufficient documentation

## 2017-02-02 DIAGNOSIS — Z79899 Other long term (current) drug therapy: Secondary | ICD-10-CM | POA: Insufficient documentation

## 2017-02-02 DIAGNOSIS — F17210 Nicotine dependence, cigarettes, uncomplicated: Secondary | ICD-10-CM | POA: Insufficient documentation

## 2017-02-02 LAB — LIPASE, BLOOD: Lipase: 24 U/L (ref 11–51)

## 2017-02-02 LAB — URINALYSIS, ROUTINE W REFLEX MICROSCOPIC
BACTERIA UA: NONE SEEN
BILIRUBIN URINE: NEGATIVE
Glucose, UA: NEGATIVE mg/dL
Ketones, ur: NEGATIVE mg/dL
LEUKOCYTES UA: NEGATIVE
NITRITE: NEGATIVE
PROTEIN: NEGATIVE mg/dL
Specific Gravity, Urine: 1.021 (ref 1.005–1.030)
pH: 6 (ref 5.0–8.0)

## 2017-02-02 LAB — COMPREHENSIVE METABOLIC PANEL
ALBUMIN: 3.6 g/dL (ref 3.5–5.0)
ALT: 12 U/L — AB (ref 14–54)
AST: 19 U/L (ref 15–41)
Alkaline Phosphatase: 52 U/L (ref 38–126)
Anion gap: 7 (ref 5–15)
BUN: 11 mg/dL (ref 6–20)
CHLORIDE: 108 mmol/L (ref 101–111)
CO2: 23 mmol/L (ref 22–32)
CREATININE: 0.83 mg/dL (ref 0.44–1.00)
Calcium: 8.6 mg/dL — ABNORMAL LOW (ref 8.9–10.3)
GFR calc non Af Amer: >60 mL/min (ref >60)
Glucose, Bld: 93 mg/dL (ref 65–99)
Potassium: 4.1 mmol/L (ref 3.5–5.1)
SODIUM: 138 mmol/L (ref 135–145)
Total Bilirubin: 0.3 mg/dL (ref 0.3–1.2)
Total Protein: 7.1 g/dL (ref 6.5–8.1)

## 2017-02-02 LAB — CBC
HCT: 32.3 % — ABNORMAL LOW (ref 35.0–47.0)
Hemoglobin: 10.6 g/dL — ABNORMAL LOW (ref 12.0–16.0)
MCH: 25.6 pg — AB (ref 26.0–34.0)
MCHC: 32.9 g/dL (ref 32.0–36.0)
MCV: 77.8 fL — ABNORMAL LOW (ref 80.0–100.0)
Platelets: 229 10*3/uL (ref 150–440)
RBC: 4.15 MIL/uL (ref 3.80–5.20)
RDW: 19.2 % — ABNORMAL HIGH (ref 11.5–14.5)
WBC: 4.8 10*3/uL (ref 3.6–11.0)

## 2017-02-02 LAB — HCG, QUANTITATIVE, PREGNANCY: hCG, Beta Chain, Quant, S: <1 m[IU]/mL (ref <5)

## 2017-02-02 MED ORDER — TRAMADOL HCL 50 MG PO TABS: 50.0000 mg | ORAL_TABLET | Freq: Four times a day (QID) | ORAL | 0 refills | Status: DC | PRN

## 2017-02-02 NOTE — ED Notes (Signed)
Urine POC attempted. Test disposed of prior to reading.

## 2017-02-02 NOTE — ED Triage Notes (Signed)
Pt reports cough and back and side pain since Saturday. Pt reports feeling lightheaded yesterday. 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

## 2017-02-02 NOTE — ED Notes (Signed)
Pt verbalized understanding of discharge instructions. NAD at this time. 

## 2017-02-02 NOTE — ED Notes (Signed)
Patient transported to Ultrasound 

## 2017-02-02 NOTE — ED Notes (Signed)
RN called lab to add on HCG since initial POC lost.

## 2017-02-02 NOTE — ED Provider Notes (Signed)
[facility] Provider [facility]  Time seen: 9:06 AM  I have reviewed the triage vital signs and the nursing notes.   HISTORY  Chief Complaint Back Pain; Right side pain; and Cough    HPI Kari Sweeney is a 26 y.o. female with a past medical history of anemia who presents to the emergency department 5 days of right back pain. According to the patient for the past 5 days she has been experiencing flank pain in the right side. Denies dysuria, hematuria. Patient states she has had similar pains one year ago when she was diagnosed with a 7 cm adrenal cyst. States she was supposed to follow-up but never did. Denies nausea vomiting. States occasional episodes of diarrhea over the past 5 days. Denies any anterior abdominal pain states her pain is only in her right flank/back. Describes the pain as moderate dull aching pain.  Past Medical History:  Diagnosis Date  . Anemia     There are no active problems to display for this patient.   Past Surgical History:  Procedure Laterality Date  . CESAREAN SECTION      Prior to Admission medications   Medication Sig Start Date End Date Taking? Authorizing Provider  etodolac (LODINE) 400 MG tablet Take 1 tablet (400 mg total) by mouth 2 (two) times daily. 07/10/16   Tommi Rumpshonda L Summers, PA-C    No Known Allergies  No family history on file.  Social History Social History  Substance Use Topics  . Smoking status: Current Some Day Smoker    Packs/day: 0.25    Types: Cigarettes  . Smokeless tobacco: Never Used  . Alcohol use 0.6 oz/week    1 Cans of beer per week     Comment: occas.     Review of Systems Constitutional: Negative for fever. Cardiovascular: Negative for chest pain. Respiratory: Negative for shortness of breath. Gastrointestinal: Right flank pain. Negative for vomiting. Negative for nausea. Genitourinary: Negative for dysuria. Musculoskeletal: Right back pain Neurological: Negative  for headache 10-point ROS otherwise negative.  ____________________________________________   PHYSICAL EXAM:  VITAL SIGNS: ED Triage Vitals  Enc Vitals Group     BP 02/02/17 0748 (!) 154/101     Pulse Rate 02/02/17 0748 87     Resp 02/02/17 0748 20     Temp 02/02/17 0748 98.2 F (36.8 C)     Temp Source 02/02/17 0748 Oral     SpO2 02/02/17 0748 99 %     Weight 02/02/17 0748 235 lb (106.6 kg)     Height 02/02/17 0748 5\' 7"  (1.702 m)     Head Circumference --      Peak Flow --      Pain Score 02/02/17 0749 10     Pain Loc --      Pain Edu? --      Excl. in GC? --     Constitutional: Alert and oriented. Well appearing and in no distress. Eyes: Normal exam ENT   Head: Normocephalic and atraumatic.   Mouth/Throat: Mucous membranes are moist. Cardiovascular: Normal rate, regular rhythm. No murmur Respiratory: Normal respiratory effort without tachypnea nor retractions. Breath sounds are clear and equal bilaterally. No wheezes/rales/rhonchi. Gastrointestinal: Soft and nontender. No distention.   Musculoskeletal: Nontender with normal range of motion in all extremities. Neurologic:  Normal speech and language. No gross focal neurologic deficits  Skin:  Skin is warm, dry and intact.  Psychiatric: Mood and affect are normal.   ____________________________________________   RADIOLOGY  [place] largely within normal limits  ____________________________________________   INITIAL IMPRESSION / ASSESSMENT AND PLAN / ED COURSE  Pertinent labs & imaging results that were available during my care of the patient were reviewed by me and considered in my medical decision making (see chart for details).  The patient presents to the emergency department right flank pain/right back pain intermittent over the past 5 days. History of adrenal cyst diagnosed one year ago she has not yet followed up. We will check labs, blood work, urinalysis, obtain a renal ultrasound to further evaluate.  Overall the patient appears well, no distress.  Patient's workup is largely within normal limits. Ultrasound is largely normal. Labs are normal. We will discharge the patient with ibuprofen and primary care follow-up regarding her adrenal follow-up.  ____________________________________________   FINAL CLINICAL IMPRESSION(S) / ED DIAGNOSES  Right flank pain    Minna Antis, MD 02/02/17 1102

## 2017-08-10 ENCOUNTER — Telehealth: Payer: Self-pay | Admitting: Emergency Medicine

## 2017-08-10 ENCOUNTER — Encounter: Payer: Self-pay | Admitting: Emergency Medicine

## 2017-08-10 ENCOUNTER — Emergency Department
Admission: EM | Admit: 2017-08-10 | Discharge: 2017-08-10 | Disposition: A | Discharge status: Home / Self Care | Payer: Medicaid Other | Attending: Emergency Medicine | Admitting: Emergency Medicine

## 2017-08-10 DIAGNOSIS — Z5321 Procedure and treatment not carried out due to patient leaving prior to being seen by health care provider: Secondary | ICD-10-CM | POA: Diagnosis not present

## 2017-08-10 DIAGNOSIS — R1011 Right upper quadrant pain: Secondary | ICD-10-CM | POA: Insufficient documentation

## 2017-08-10 HISTORY — DX: Other specified disorders of adrenal gland: E27.8

## 2017-08-10 LAB — URINALYSIS, COMPLETE (UACMP) WITH MICROSCOPIC
Bacteria, UA: NONE SEEN
Bilirubin Urine: NEGATIVE
GLUCOSE, UA: NEGATIVE mg/dL
Hgb urine dipstick: NEGATIVE
Ketones, ur: NEGATIVE mg/dL
Leukocytes, UA: NEGATIVE
Nitrite: NEGATIVE
PROTEIN: NEGATIVE mg/dL
SPECIFIC GRAVITY, URINE: 1.001 — AB (ref 1.005–1.030)
pH: 7 (ref 5.0–8.0)

## 2017-08-10 LAB — CBC
HCT: 31.9 % — ABNORMAL LOW (ref 35.0–47.0)
Hemoglobin: 10.3 g/dL — ABNORMAL LOW (ref 12.0–16.0)
MCH: 25.2 pg — AB (ref 26.0–34.0)
MCHC: 32.3 g/dL (ref 32.0–36.0)
MCV: 77.9 fL — AB (ref 80.0–100.0)
PLATELETS: 303 10*3/uL (ref 150–440)
RBC: 4.09 MIL/uL (ref 3.80–5.20)
RDW: 18.3 % — ABNORMAL HIGH (ref 11.5–14.5)
WBC: 8 10*3/uL (ref 3.6–11.0)

## 2017-08-10 LAB — COMPREHENSIVE METABOLIC PANEL
ALBUMIN: 3.8 g/dL (ref 3.5–5.0)
ALK PHOS: 55 U/L (ref 38–126)
ALT: 11 U/L — AB (ref 14–54)
AST: 19 U/L (ref 15–41)
Anion gap: 6 (ref 5–15)
BUN: 9 mg/dL (ref 6–20)
CHLORIDE: 108 mmol/L (ref 101–111)
CO2: 24 mmol/L (ref 22–32)
CREATININE: 0.85 mg/dL (ref 0.44–1.00)
Calcium: 9.1 mg/dL (ref 8.9–10.3)
GFR calc non Af Amer: >60 mL/min (ref >60)
GLUCOSE: 108 mg/dL — AB (ref 65–99)
Potassium: 3.8 mmol/L (ref 3.5–5.1)
Sodium: 138 mmol/L (ref 135–145)
Total Bilirubin: 0.3 mg/dL (ref 0.3–1.2)
Total Protein: 6.9 g/dL (ref 6.5–8.1)

## 2017-08-10 LAB — LIPASE, BLOOD: LIPASE: 19 U/L (ref 11–51)

## 2017-08-10 LAB — POCT PREGNANCY, URINE: PREG TEST UR: NEGATIVE

## 2017-08-10 NOTE — ED Triage Notes (Signed)
Patient to ER for continued and worsening pain to RUQ abd/epigastric area. Patient states pain is same quality as pain she has had associated with adrenal cyst that measured 8cm at time of discovery. States she was trying to avoid having surgery for now, but feels as though her abdomen has gotten larger and pain has not improved.

## 2017-08-10 NOTE — Telephone Encounter (Signed)
Called patient due to lwot to inquire about condition and follow up plans. Left message.   

## 2018-04-25 ENCOUNTER — Ambulatory Visit: Payer: Self-pay | Admitting: Obstetrics & Gynecology

## 2018-11-14 IMAGING — US US RENAL
1 series · 14 of 25 positions shown · non-contrast
Comparison: CT abdomen and pelvis 01/05/2016.

CLINICAL DATA: Right flank pain for 4 days.

EXAM:
RENAL / URINARY TRACT ULTRASOUND COMPLETE

[Series 1: us renal · 0.25mm/px · 14 of 47 slices shown]
[im 1/47]
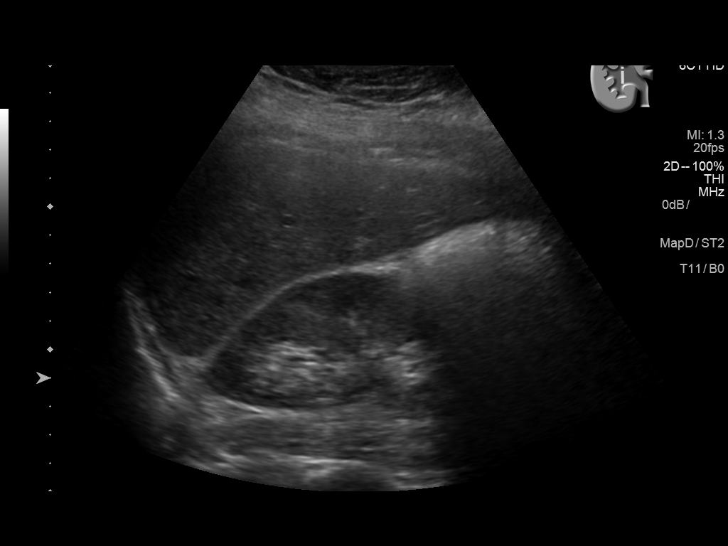
[im 4/47]
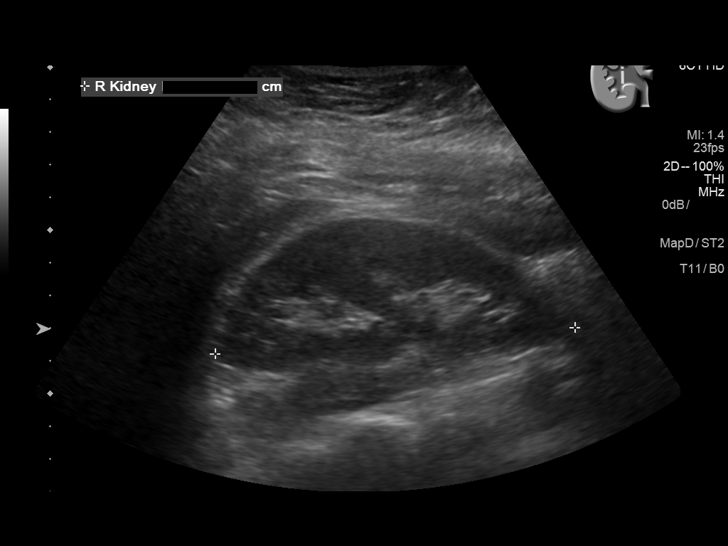
[im 8/47]
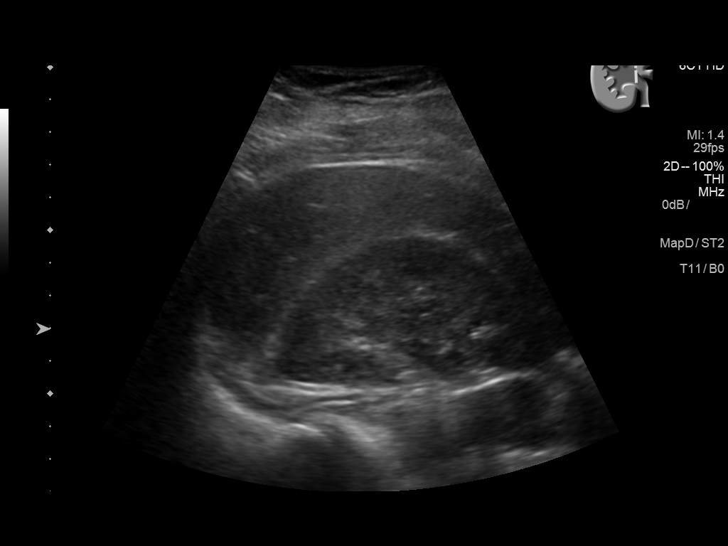
[im 12/47]
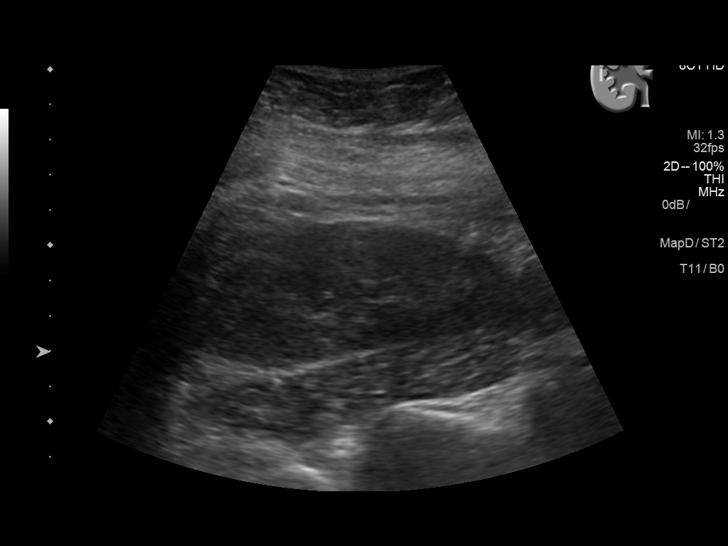
[im 16/47]
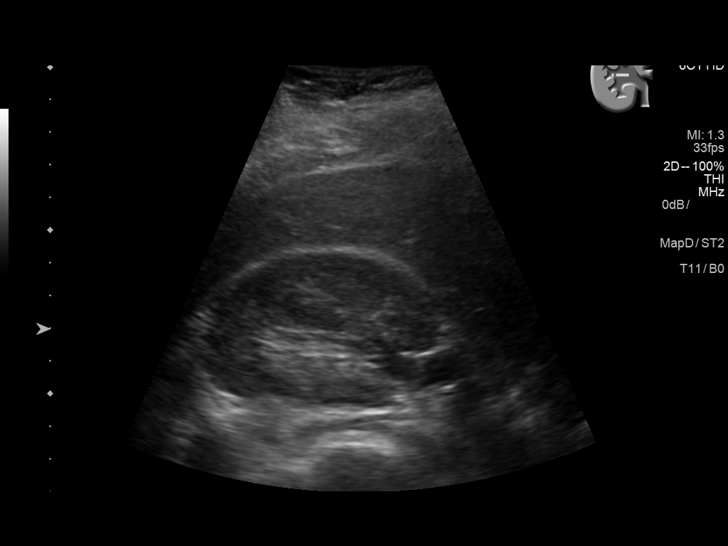
[im 18/47]
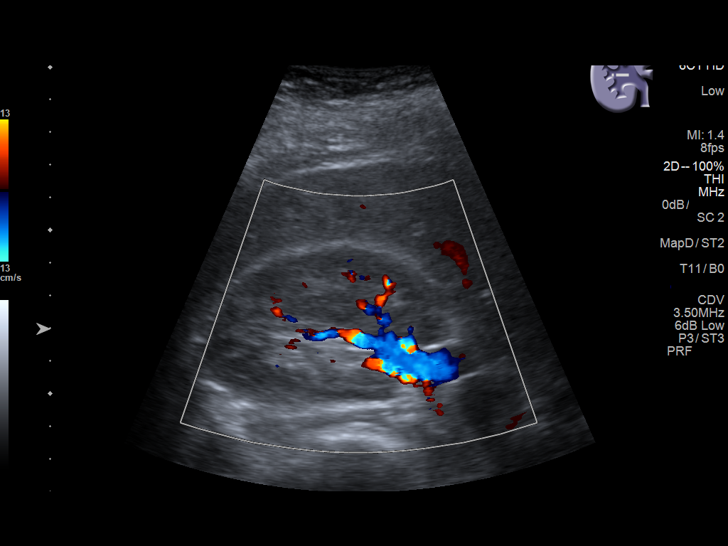
[im 22/47]
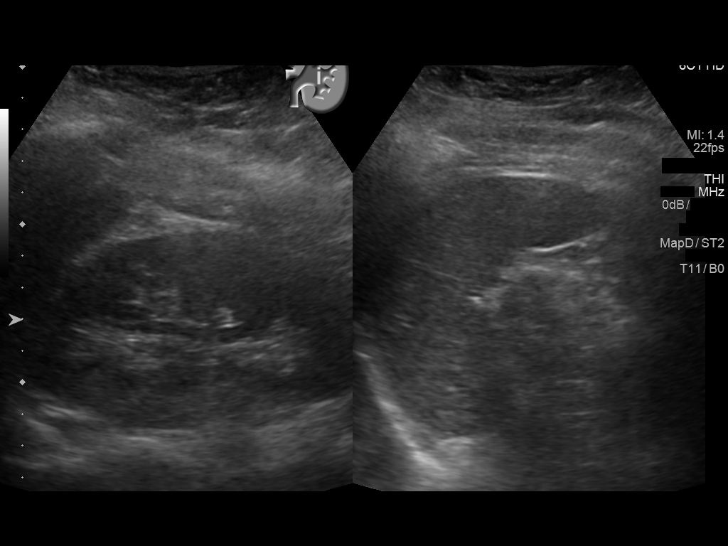
[im 25/47]
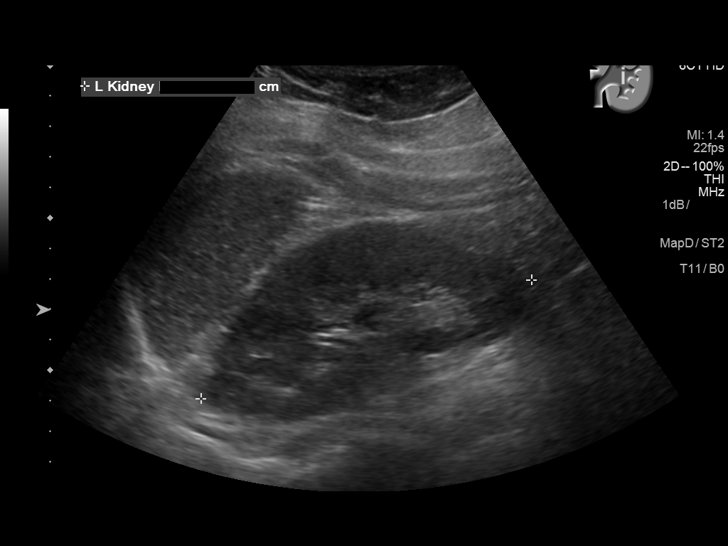
[im 29/47]
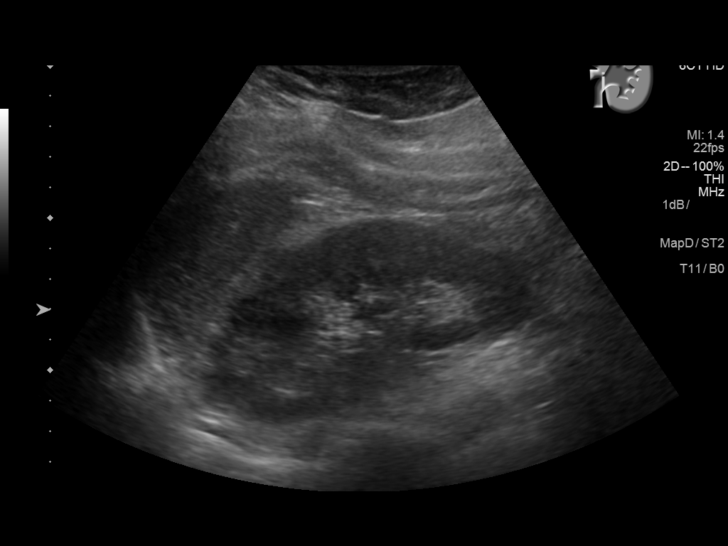
[im 31/47]
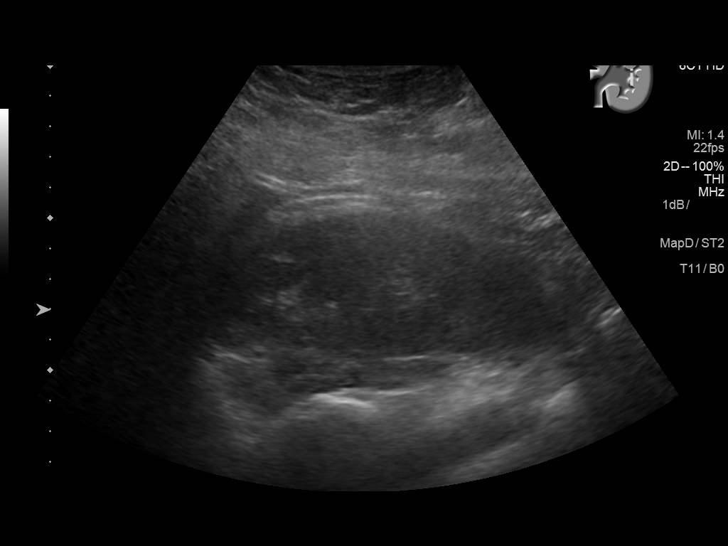
[im 35/47]
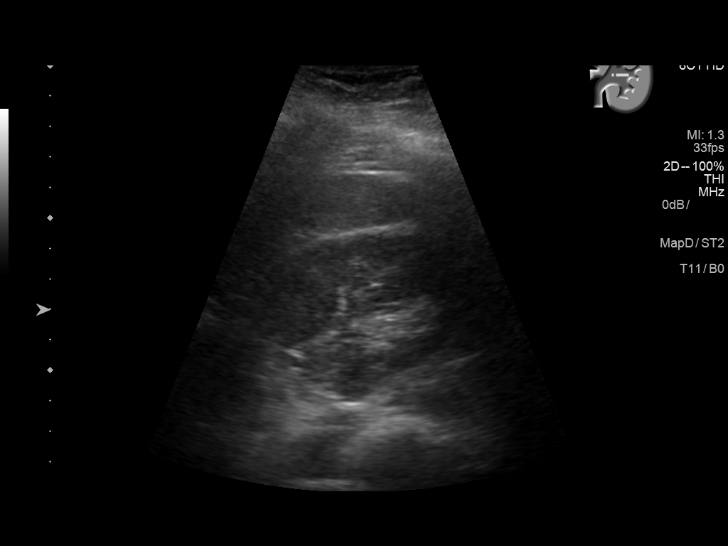
[im 39/47]
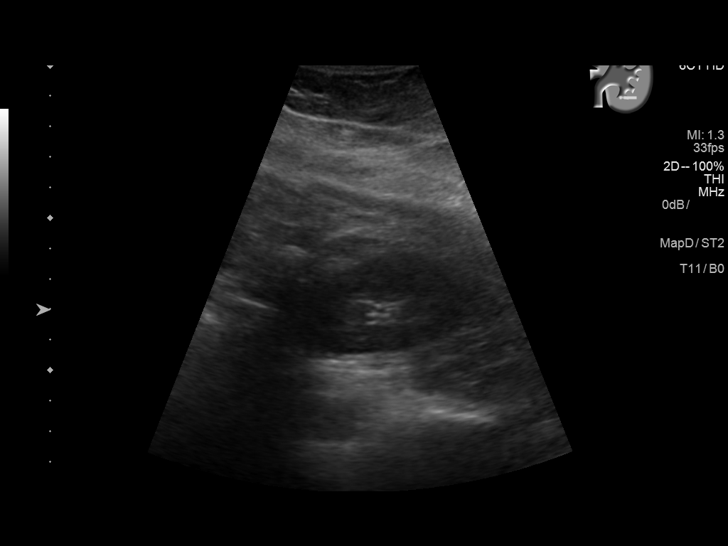
[im 43/47]
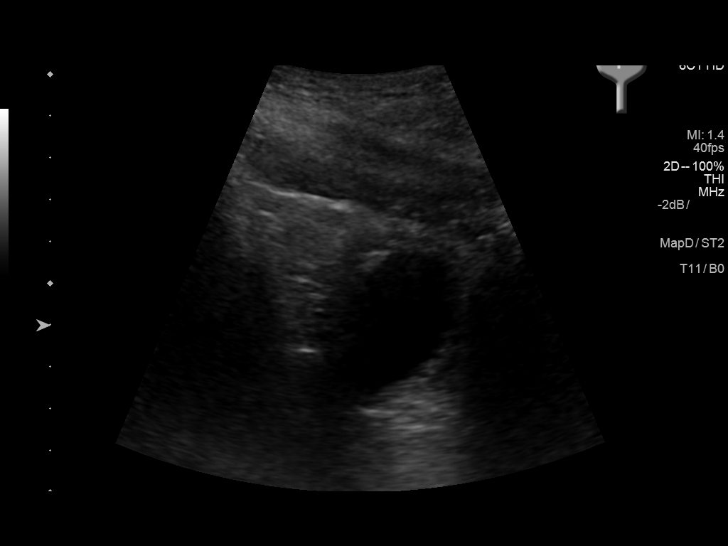
[im 47/47]
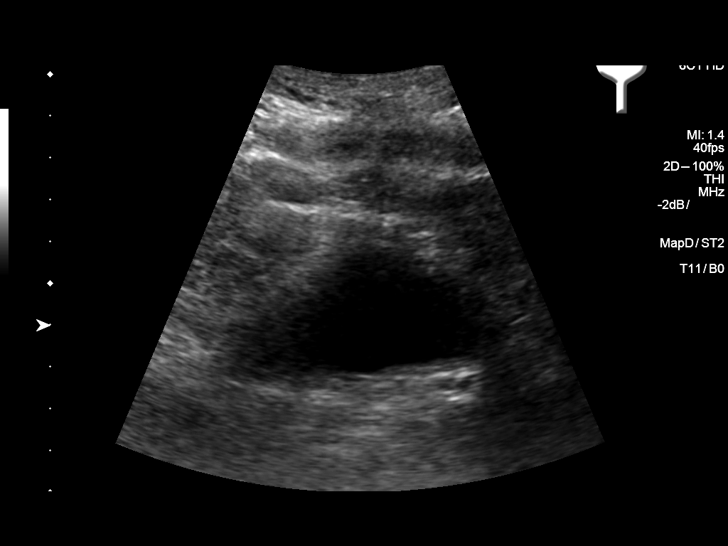

[14 of 25 positions shown; findings below may reference images not displayed]

FINDINGS: Right Kidney:

Length: 11.0 cm. Echogenicity within normal limits. No mass or
hydronephrosis visualized. Large cystic lesion off the upper pole of
the right kidney seen on prior CT is not visualized on this exam.

Left Kidney:

Length: 11.5 cm. Echogenicity within normal limits. No mass or
hydronephrosis visualized.

Bladder:

Appears normal for degree of bladder distention.
IMPRESSION: Normal examination. Large cystic lesion off the upper pole of the
right kidney seen on prior CT scan is not visualized on this
examination.

## 2020-05-08 ENCOUNTER — Other Ambulatory Visit: Payer: Self-pay

## 2020-05-08 ENCOUNTER — Ambulatory Visit
Admission: EM | Admit: 2020-05-08 | Discharge: 2020-05-08 | Disposition: A | Discharge status: Home / Self Care | Payer: Self-pay | Attending: Emergency Medicine | Admitting: Emergency Medicine

## 2020-05-08 DIAGNOSIS — R062 Wheezing: Secondary | ICD-10-CM

## 2020-05-08 DIAGNOSIS — J069 Acute upper respiratory infection, unspecified: Secondary | ICD-10-CM

## 2020-05-08 DIAGNOSIS — Z20822 Contact with and (suspected) exposure to covid-19: Secondary | ICD-10-CM

## 2020-05-08 MED ORDER — PREDNISONE 10 MG (21) PO TBPK: ORAL_TABLET | Freq: Every day | ORAL | 0 refills | Status: DC

## 2020-05-08 MED ORDER — FLUTICASONE PROPIONATE 50 MCG/ACT NA SUSP: 2.0000 | Freq: Every day | NASAL | 0 refills | Status: DC

## 2020-05-08 MED ORDER — CETIRIZINE HCL 10 MG PO TABS: 10.0000 mg | ORAL_TABLET | Freq: Every day | ORAL | 0 refills | Status: DC

## 2020-05-08 MED ORDER — ALBUTEROL SULFATE HFA 108 (90 BASE) MCG/ACT IN AERS: 1.0000 | INHALATION_SPRAY | Freq: Four times a day (QID) | RESPIRATORY_TRACT | 0 refills | Status: DC | PRN

## 2020-05-08 MED ORDER — BENZONATATE 100 MG PO CAPS: 100.0000 mg | ORAL_CAPSULE | Freq: Three times a day (TID) | ORAL | 0 refills | Status: DC

## 2020-05-08 NOTE — ED Provider Notes (Addendum)
[facility]   [id number] 05/08/20 Arrival Time: 0950   CC: COVID symptoms  SUBJECTIVE: History from: patient  Kari Sweeney is a 29 y.o. female who presents with congestion, runny nose, dry cough, and wheezing x 2 days.  Denies sick exposure to COVID, flu or strep.  Denies recent travel.  Denies alleviating or aggravating factors.  Reports previous symptoms in the past with the flu.   Denies fever, chills, sore throat, SOB, chest pain, nausea, changes in bowel or bladder habits.    ROS: As per HPI.  All other pertinent ROS negative.     Past Medical History:  Diagnosis Date  . Adrenal cyst (HCC)   . Anemia    Past Surgical History:  Procedure Laterality Date  . CESAREAN SECTION     No Known Allergies No current facility-administered medications on file prior to encounter.   Current Outpatient Medications on File Prior to Encounter  Medication Sig Dispense Refill  . etodolac (LODINE) 400 MG tablet Take 1 tablet (400 mg total) by mouth 2 (two) times daily. 20 tablet 0  . traMADol (ULTRAM) 50 MG tablet Take 1 tablet (50 mg total) by mouth every 6 (six) hours as needed. 10 tablet 0   Social History   Socioeconomic History  . Marital status: Single    Spouse name: Not on file  . Number of children: Not on file  . Years of education: Not on file  . Highest education level: Not on file  Occupational History  . Not on file  Tobacco Use  . Smoking status: Current Some Day Smoker    Packs/day: 0.25    Types: Cigarettes  . Smokeless tobacco: Never Used  Substance and Sexual Activity  . Alcohol use: Yes    Alcohol/week: 1.0 standard drinks    Types: 1 Cans of beer per week    Comment: occas.   . Drug use: No  . Sexual activity: Yes    Birth control/protection: None  Other Topics Concern  . Not on file  Social History Narrative  . Not on file   Social Determinants of Health   Financial Resource Strain:   . Difficulty of Paying Living Expenses:   Food  Insecurity:   . Worried About Programme researcher, broadcasting/film/video in the Last Year:   . Barista in the Last Year:   Transportation Needs:   . Freight forwarder (Medical):   Marland Kitchen Lack of Transportation (Non-Medical):   Physical Activity:   . Days of Exercise per Week:   . Minutes of Exercise per Session:   Stress:   . Feeling of Stress :   Social Connections:   . Frequency of Communication with Friends and Family:   . Frequency of Social Gatherings with Friends and Family:   . Attends Religious Services:   . Active Member of Clubs or Organizations:   . Attends Banker Meetings:   Marland Kitchen Marital Status:   Intimate Partner Violence:   . Fear of Current or Ex-Partner:   . Emotionally Abused:   Marland Kitchen Physically Abused:   . Sexually Abused:    History reviewed. No pertinent family history.  OBJECTIVE:  Vitals:   05/08/20 1008  BP: 135/88  Pulse: 86  Resp: 18  Temp: (!) 97.4 F (36.3 C)  SpO2: 98%     General appearance: alert; appears fatigued, but nontoxic; speaking in full sentences and tolerating own secretions HEENT: NCAT; Ears: EACs clear, TMs pearly gray; Eyes: PERRL.  EOM grossly intact. Nose: nares patent without rhinorrhea, Throat: oropharynx clear, tonsils non erythematous or enlarged, uvula midline  Neck: supple without LAD Lungs: unlabored respirations, symmetrical air entry; cough: moderate; no respiratory distress; wheezes heard throughout bilateral chest Heart: regular rate and rhythm.   Skin: warm and dry Psychological: alert and cooperative; normal mood and affect  ASSESSMENT & PLAN:  1. Viral URI with cough   2. Wheezing   3. Suspected COVID-19 virus infection     Meds ordered this encounter  Medications  . predniSONE (STERAPRED UNI-PAK 21 TAB) 10 MG (21) TBPK tablet    Sig: Take by mouth daily. Take 6 tabs by mouth daily  for 2 days, then 5 tabs for 2 days, then 4 tabs for 2 days, then 3 tabs for 2 days, 2 tabs for 2 days, then 1 tab by mouth daily  for 2 days    Dispense:  42 tablet    Refill:  0    Order Specific Question:   Supervising Provider    Answer:   Raylene Everts [[id number]]  . fluticasone (FLONASE) 50 MCG/ACT nasal spray    Sig: Place 2 sprays into both nostrils daily.    Dispense:  16 g    Refill:  0    Order Specific Question:   Supervising Provider    Answer:   Raylene Everts [[id number]]  . benzonatate (TESSALON) 100 MG capsule    Sig: Take 1 capsule (100 mg total) by mouth every 8 (eight) hours.    Dispense:  21 capsule    Refill:  0    Order Specific Question:   Supervising Provider    Answer:   Raylene Everts [[id number]]  . cetirizine (ZYRTEC) 10 MG tablet    Sig: Take 1 tablet (10 mg total) by mouth daily.    Dispense:  30 tablet    Refill:  0    Order Specific Question:   Supervising Provider    Answer:   Raylene Everts [[id number]]  . albuterol (VENTOLIN HFA) 108 (90 Base) MCG/ACT inhaler    Sig: Inhale 1-2 puffs into the lungs every 6 (six) hours as needed for wheezing or shortness of breath.    Dispense:  18 g    Refill:  0    Order Specific Question:   Supervising Provider    Answer:   Raylene Everts [[id number]]   COVID testing ordered.  It will take between 2-5 days for test results.  Someone will contact you regarding abnormal results.    In the meantime: You should remain isolated in your home for 10 days from symptom onset AND greater than 72 hours after symptoms resolution (absence of fever without the use of fever-reducing medication and improvement in respiratory symptoms), whichever is longer Get plenty of rest and push fluids Prednisone and albuterol inhaler for wheezing Tessalon Perles prescribed for cough zyrtec for nasal congestion, runny nose, and/or sore throat flonase for nasal congestion and runny nose Use medications daily for symptom relief Use OTC medications like ibuprofen or tylenol as needed fever or pain Call or go to the ED if you have any new or worsening  symptoms such as fever, worsening cough, shortness of breath, chest tightness, chest pain, turning blue, changes in mental status, etc...   Reviewed expectations re: course of current medical issues. Questions answered. Outlined signs and symptoms indicating need for more acute intervention. Patient verbalized understanding. After Visit Summary given.  Rennis Harding, PA-C 05/08/20 1046    Alvino Chapel [place], PA-C 05/08/20 1048

## 2020-05-08 NOTE — Discharge Instructions (Signed)
COVID testing ordered.  It will take between 2-5 days for test results.  Someone will contact you regarding abnormal results.    In the meantime: You should remain isolated in your home for 10 days from symptom onset AND greater than 72 hours after symptoms resolution (absence of fever without the use of fever-reducing medication and improvement in respiratory symptoms), whichever is longer Get plenty of rest and push fluids Prednisone and albuterol inhaler for wheezing Tessalon Perles prescribed for cough zyrtec for nasal congestion, runny nose, and/or sore throat flonase for nasal congestion and runny nose Use medications daily for symptom relief Use OTC medications like ibuprofen or tylenol as needed fever or pain Call or go to the ED if you have any new or worsening symptoms such as fever, worsening cough, shortness of breath, chest tightness, chest pain, turning blue, changes in mental status, etc..Marland Kitchen

## 2020-05-08 NOTE — ED Triage Notes (Signed)
Pt presents with cough and sob for past couple of days

## 2020-05-09 LAB — NOVEL CORONAVIRUS, NAA: SARS-CoV-2, NAA: NOT DETECTED

## 2020-05-09 LAB — SARS-COV-2, NAA 2 DAY TAT

## 2020-08-12 ENCOUNTER — Ambulatory Visit
Admission: EM | Admit: 2020-08-12 | Discharge: 2020-08-12 | Disposition: A | Discharge status: Home / Self Care | Payer: 59 | Attending: Emergency Medicine | Admitting: Emergency Medicine

## 2020-08-12 ENCOUNTER — Other Ambulatory Visit: Payer: Self-pay

## 2020-08-12 DIAGNOSIS — Z20822 Contact with and (suspected) exposure to covid-19: Secondary | ICD-10-CM

## 2020-08-12 NOTE — ED Triage Notes (Signed)
covid test no s/s  °

## 2020-08-14 LAB — NOVEL CORONAVIRUS, NAA: SARS-CoV-2, NAA: NOT DETECTED

## 2020-08-14 LAB — SARS-COV-2, NAA 2 DAY TAT

## 2022-10-15 ENCOUNTER — Ambulatory Visit
Admission: RE | Admit: 2022-10-15 | Discharge: 2022-10-15 | Disposition: A | Discharge status: Home / Self Care | Payer: 59 | Source: Ambulatory Visit | Attending: Nurse Practitioner | Admitting: Nurse Practitioner

## 2022-10-15 VITALS — BP 142/87 | HR 92 | Temp 98.5°F | Resp 16

## 2022-10-15 DIAGNOSIS — R21 Rash and other nonspecific skin eruption: Secondary | ICD-10-CM

## 2022-10-15 MED ORDER — PREDNISONE 20 MG PO TABS: 40.0000 mg | ORAL_TABLET | Freq: Every day | ORAL | 0 refills | Status: AC

## 2022-10-15 MED ORDER — CLOTRIMAZOLE-BETAMETHASONE 1-0.05 % EX CREA: TOPICAL_CREAM | CUTANEOUS | 0 refills | Status: DC

## 2022-10-15 NOTE — Discharge Instructions (Addendum)
Take medication as prescribed. May also take over-the-counter Zyrtec to help with itching. Avoid hot baths or showers while symptoms persist.  Recommend taking lukewarm baths. May apply cool cloths to the area to help with itching or discomfort. Avoid scratching, rubbing, or manipulating the areas while symptoms persist. Recommend Aveeno colloidal oatmeal bath to use to help with drying and itching. Strict hand hygiene to prevent spread of symptoms. Follow up if symptoms do not improve.

## 2022-10-15 NOTE — ED Provider Notes (Signed)
[facility]    CSN: [id number] Arrival date & time: 10/15/22  1443      History   Chief Complaint Chief Complaint  Patient presents with   Rash    Entered by patient    HPI Kari Sweeney is a 31 y.o. female.   The history is provided by the patient.   Patient presents with a 2-week history of rash.  She states that the first area started on her right lower extremity, and has since spread to her right arm/shoulder, left neck, and left breast.  She states the rash started as a round area that was clear in the middle.  She states that the area is also "very itchy".  She denies fever, chills, exposure to new soaps, foods, medications, detergents, or lotions.  She states that she does have a pet in the home.  She states that no one else in her home has the symptoms.  She states that she used "pee" when the first area on her right lower extremity developed, which seem to help it, but then she noticed that the rash began to spread to other areas of her body.  She is wondering if she may have eczema.  Past Medical History:  Diagnosis Date   Adrenal cyst (HCC)    Anemia     There are no problems to display for this patient.   Past Surgical History:  Procedure Laterality Date   CESAREAN SECTION      OB History   No obstetric history on file.      Home Medications    Prior to Admission medications   Medication Sig Start Date End Date Taking? Authorizing Provider  clotrimazole-betamethasone (LOTRISONE) cream Apply to affected area 2 times daily prn 10/15/22  Yes Laruen Risser-Warren, Sadie Haber, NP  predniSONE (DELTASONE) 20 MG tablet Take 2 tablets (40 mg total) by mouth daily with breakfast for 5 days. 10/15/22 10/20/22 Yes Kami Kube-Warren, Sadie Haber, NP  albuterol (VENTOLIN HFA) 108 (90 Base) MCG/ACT inhaler Inhale 1-2 puffs into the lungs every 6 (six) hours as needed for wheezing or shortness of breath. 05/08/20   Wurst, [place], PA-C  benzonatate (TESSALON) 100 MG  capsule Take 1 capsule (100 mg total) by mouth every 8 (eight) hours. 05/08/20   Wurst, [place], PA-C  cetirizine (ZYRTEC) 10 MG tablet Take 1 tablet (10 mg total) by mouth daily. 05/08/20   Wurst, [place], PA-C  etodolac (LODINE) 400 MG tablet Take 1 tablet (400 mg total) by mouth 2 (two) times daily. 07/10/16   Tommi Rumps, PA-C  fluticasone (FLONASE) 50 MCG/ACT nasal spray Place 2 sprays into both nostrils daily. 05/08/20   Wurst, [place], PA-C  traMADol (ULTRAM) 50 MG tablet Take 1 tablet (50 mg total) by mouth every 6 (six) hours as needed. 02/02/17   Minna Antis, MD    Family History History reviewed. No pertinent family history.  Social History Social History   Tobacco Use   Smoking status: Some Days    Packs/day: 0.25    Types: Cigarettes   Smokeless tobacco: Never  Substance Use Topics   Alcohol use: Yes    Alcohol/week: 1.0 standard drink of alcohol    Types: 1 Cans of beer per week    Comment: occas.    Drug use: No     Allergies   Patient has no known allergies.   Review of Systems Review of Systems Per HPI  Physical Exam Triage Vital Signs ED Triage Vitals  Enc Vitals  Group     BP 10/15/22 1451 (!) 142/87     Pulse Rate 10/15/22 1451 92     Resp 10/15/22 1451 16     Temp 10/15/22 1451 98.5 F (36.9 C)     Temp Source 10/15/22 1451 Oral     SpO2 10/15/22 1451 98 %     Weight --      Height --      Head Circumference --      Peak Flow --      Pain Score 10/15/22 1452 0     Pain Loc --      Pain Edu? --      Excl. in [place]? --    No data found.  Updated Vital Signs BP (!) 142/87 (BP Location: Right Arm)   Pulse 92   Temp 98.5 F (36.9 C) (Oral)   Resp 16   LMP 10/01/2022 (Approximate)   SpO2 98%   Visual Acuity Right Eye Distance:   Left Eye Distance:   Bilateral Distance:    Right Eye Near:   Left Eye Near:    Bilateral Near:     Physical Exam Vitals and nursing note reviewed.  Constitutional:      General: She is not in  acute distress.    Appearance: Normal appearance.  HENT:     Head: Normocephalic.  Eyes:     Extraocular Movements: Extraocular movements intact.     Conjunctiva/sclera: Conjunctivae normal.     Pupils: Pupils are equal, round, and reactive to light.  Pulmonary:     Effort: Pulmonary effort is normal.  Skin:    General: Skin is warm and dry.     Findings: Rash present.     Comments: Annular or ring shaped areas with central clearing noted to various areas of the body.  Borders of the rash are raised, somewhat erythematous in the center, some with crusting and drying.  Neurological:     General: No focal deficit present.     Mental Status: She is alert and oriented to person, place, and time.  Psychiatric:        Mood and Affect: Mood normal.        Behavior: Behavior normal.      UC Treatments / Results  Labs (all labs ordered are listed, but only abnormal results are displayed) Labs Reviewed - No data to display  EKG   Radiology No results found.  Procedures Procedures (including critical care time)  Medications Ordered in UC Medications - No data to display  Initial Impression / Assessment and Plan / UC Course  I have reviewed the triage vital signs and the nursing notes.  Pertinent labs & imaging results that were available during my care of the patient were reviewed by me and considered in my medical decision making (see chart for details).  Patient presents with a 2-week history of rash.  On exam, patient has multiple areas of annular ring shaped patches with central clearing noted.  Borders of the rash are raised.  Symptoms are consistent with tinea corporis.  1.  Rash with nonspecific skin skin eruption  Lotrisone cream to apply to the affected areas twice daily until symptoms improve. Prednisone 40 mg daily for 5 days to help with itching. Avoid scratching, rubbing, or manipulating the areas while symptoms persist. Lukewarm baths or showers while symptoms  persist. Recommend using Aveeno colloidal oatmeal bath to help with drying and itching of the rash. Strict hand hygiene while symptoms persist  as rash is highly contagious. Follow-up with your primary care physician if symptoms do not improve. Patient verbalizes understanding of discharge instructions.  Patient is stable for discharge, all questions were answered. Final Clinical Impressions(s) / UC Diagnoses   Final diagnoses:  Rash and nonspecific skin eruption     Discharge Instructions      Take medication as prescribed. May also take over-the-counter Zyrtec to help with itching. Avoid hot baths or showers while symptoms persist.  Recommend taking lukewarm baths. May apply cool cloths to the area to help with itching or discomfort. Avoid scratching, rubbing, or manipulating the areas while symptoms persist. Recommend Aveeno colloidal oatmeal bath to use to help with drying and itching. Strict hand hygiene to prevent spread of symptoms. Follow up if symptoms do not improve.      ED Prescriptions     Medication Sig Dispense Auth. Provider   clotrimazole-betamethasone (LOTRISONE) cream Apply to affected area 2 times daily prn 45 g Shaneil Yazdi-Warren, Sadie Haber, NP   predniSONE (DELTASONE) 20 MG tablet Take 2 tablets (40 mg total) by mouth daily with breakfast for 5 days. 10 tablet Aydrien Froman-Warren, Sadie Haber, NP      PDMP not reviewed this encounter.   Abran Cantor, NP 10/15/22 1537

## 2022-10-15 NOTE — ED Triage Notes (Signed)
Pt states itchy patches of dried skin all over her body for the past 2 weeks.

## 2022-12-13 ENCOUNTER — Encounter: Payer: Self-pay | Admitting: Emergency Medicine

## 2022-12-13 ENCOUNTER — Emergency Department: Payer: 59

## 2022-12-13 DIAGNOSIS — R6883 Chills (without fever): Secondary | ICD-10-CM | POA: Diagnosis not present

## 2022-12-13 DIAGNOSIS — R059 Cough, unspecified: Secondary | ICD-10-CM | POA: Insufficient documentation

## 2022-12-13 DIAGNOSIS — R0981 Nasal congestion: Secondary | ICD-10-CM | POA: Insufficient documentation

## 2022-12-13 DIAGNOSIS — Z1152 Encounter for screening for COVID-19: Secondary | ICD-10-CM | POA: Insufficient documentation

## 2022-12-13 DIAGNOSIS — M7918 Myalgia, other site: Secondary | ICD-10-CM | POA: Diagnosis present

## 2022-12-13 NOTE — ED Triage Notes (Signed)
Pt presents via POV with complaints of generalized body aches, nasal congestion, cough, and chills for the last week. Pt states that her sx have gotten worse over the last few days. Taken OTC cough meds with minimal improvement. Denies CP or SOB.

## 2022-12-14 ENCOUNTER — Emergency Department
Admission: EM | Admit: 2022-12-14 | Discharge: 2022-12-14 | Disposition: A | Discharge status: Home / Self Care | Payer: 59 | Attending: Emergency Medicine | Admitting: Emergency Medicine

## 2022-12-14 DIAGNOSIS — J111 Influenza due to unidentified influenza virus with other respiratory manifestations: Secondary | ICD-10-CM

## 2022-12-14 LAB — RESP PANEL BY RT-PCR (RSV, FLU A&B, COVID)  RVPGX2
Influenza A by PCR: NEGATIVE
Influenza B by PCR: NEGATIVE
Resp Syncytial Virus by PCR: NEGATIVE
SARS Coronavirus 2 by RT PCR: NEGATIVE

## 2022-12-14 MED ORDER — NAPROXEN 500 MG PO TABS: 500.0000 mg | ORAL_TABLET | Freq: Two times a day (BID) | ORAL | 0 refills | Status: DC

## 2022-12-14 MED ORDER — ONDANSETRON 4 MG PO TBDP: 4.0000 mg | ORAL_TABLET | Freq: Three times a day (TID) | ORAL | 0 refills | Status: DC | PRN

## 2022-12-14 NOTE — ED Provider Notes (Signed)
   [facility] Provider Note    Event Date/Time   First MD Initiated Contact with Patient 12/14/22 [phone]     (approximate)   History   Chief Complaint: Generalized Body Aches   HPI  Kari Sweeney is a 32 y.o. female  who c/o body aches, cough, congestion, chills, fatigue x 1 week. No vomiting, diarrhea. Eating okay       Physical Exam   Triage Vital Signs: ED Triage Vitals  Enc Vitals Group     BP 12/13/22 2243 (!) 150/88     Pulse Rate 12/13/22 2243 84     Resp 12/13/22 2243 20     Temp 12/13/22 2243 97.8 F (36.6 C)     Temp Source 12/13/22 2243 Oral     SpO2 12/13/22 2243 99 %     Weight 12/13/22 2321 230 lb (104.3 kg)     Height 12/13/22 2321 5\' 7"  (1.702 m)     Head Circumference --      Peak Flow --      Pain Score 12/13/22 2321 7     Pain Loc --      Pain Edu? --      Excl. in [place]? --     Most recent vital signs: Vitals:   12/13/22 2243 12/14/22 0517  BP: (!) 150/88 (!) 138/95  Pulse: 84 65  Resp: 20 17  Temp: 97.8 F (36.6 C) 98 F (36.7 C)  SpO2: 99% 96%    General: Awake, no distress.  CV:  Good peripheral perfusion. RRR Resp:  Normal effort. ctab Abd:  No distention.  Other:  MMM   ED Results / Procedures / Treatments   Labs (all labs ordered are listed, but only abnormal results are displayed) Labs Reviewed  RESP PANEL BY RT-PCR (RSV, FLU A&B, COVID)  [id number]     EKG    RADIOLOGY Cxr interpreted by me, unremarkable. Radiology report reviewed   PROCEDURES:  Procedures   MEDICATIONS ORDERED IN ED: Medications - No data to display   IMPRESSION / MDM / [place] / ED COURSE  I reviewed the triage vital signs and the nursing notes.                                Patient's presentation is most consistent with acute illness / injury with system symptoms.  Pt p/w ILI. Exam, VS, cxr all okay. Viral swab neg. Supportive care. WN given.       FINAL CLINICAL IMPRESSION(S) / ED  DIAGNOSES   Final diagnoses:  Influenza-like illness     Rx / DC Orders   ED Discharge Orders          Ordered    naproxen (NAPROSYN) 500 MG tablet  2 times daily with meals        12/14/22 0521    ondansetron (ZOFRAN-ODT) 4 MG disintegrating tablet  Every 8 hours PRN        12/14/22 0521             Note:  This document was prepared using Dragon voice recognition software and may include unintentional dictation errors.   Carrie Mew, MD 12/14/22 [phone]

## 2023-03-30 ENCOUNTER — Ambulatory Visit: Payer: 59

## 2023-09-01 ENCOUNTER — Other Ambulatory Visit: Payer: Self-pay

## 2023-09-01 ENCOUNTER — Emergency Department
Admission: EM | Admit: 2023-09-01 | Discharge: 2023-09-01 | Disposition: A | Discharge status: Home / Self Care | Payer: 59 | Attending: Emergency Medicine | Admitting: Emergency Medicine

## 2023-09-01 ENCOUNTER — Encounter: Payer: Self-pay | Admitting: Emergency Medicine

## 2023-09-01 ENCOUNTER — Emergency Department: Payer: 59

## 2023-09-01 DIAGNOSIS — S61210A Laceration without foreign body of right index finger without damage to nail, initial encounter: Secondary | ICD-10-CM | POA: Insufficient documentation

## 2023-09-01 DIAGNOSIS — S51811A Laceration without foreign body of right forearm, initial encounter: Secondary | ICD-10-CM | POA: Diagnosis not present

## 2023-09-01 DIAGNOSIS — S6991XA Unspecified injury of right wrist, hand and finger(s), initial encounter: Secondary | ICD-10-CM | POA: Diagnosis present

## 2023-09-01 DIAGNOSIS — S61212A Laceration without foreign body of right middle finger without damage to nail, initial encounter: Secondary | ICD-10-CM | POA: Diagnosis not present

## 2023-09-01 DIAGNOSIS — S61421A Laceration with foreign body of right hand, initial encounter: Secondary | ICD-10-CM | POA: Diagnosis not present

## 2023-09-01 DIAGNOSIS — W228XXA Striking against or struck by other objects, initial encounter: Secondary | ICD-10-CM | POA: Insufficient documentation

## 2023-09-01 DIAGNOSIS — Z23 Encounter for immunization: Secondary | ICD-10-CM | POA: Insufficient documentation

## 2023-09-01 MED ORDER — CEPHALEXIN 500 MG PO CAPS: 500.0000 mg | ORAL_CAPSULE | Freq: Four times a day (QID) | ORAL | 0 refills | Status: DC

## 2023-09-01 MED ORDER — LIDOCAINE-EPINEPHRINE (PF) 2 %-1:200000 IJ SOLN
20.0000 mL | Freq: Once | INTRAMUSCULAR | Status: AC
  Administered 2023-09-01: 20 mL via INTRADERMAL

## 2023-09-01 MED ORDER — ONDANSETRON 4 MG PO TBDP
4.0000 mg | ORAL_TABLET | Freq: Once | ORAL | Status: AC
  Administered 2023-09-01: 4 mg via ORAL
  Filled 2023-09-01: qty 1

## 2023-09-01 MED ORDER — TETANUS-DIPHTH-ACELL PERTUSSIS 5-2.5-18.5 LF-MCG/0.5 IM SUSY
0.5000 mL | PREFILLED_SYRINGE | Freq: Once | INTRAMUSCULAR | Status: AC
  Administered 2023-09-01: 0.5 mL via INTRAMUSCULAR
  Filled 2023-09-01: qty 0.5

## 2023-09-01 MED ORDER — ONDANSETRON HCL 4 MG/2ML IJ SOLN
4.0000 mg | Freq: Once | INTRAMUSCULAR | Status: AC
  Administered 2023-09-01: 4 mg via INTRAVENOUS
  Filled 2023-09-01: qty 2

## 2023-09-01 MED ORDER — LIDOCAINE HCL (PF) 1 % IJ SOLN
5.0000 mL | Freq: Once | INTRAMUSCULAR | Status: AC
  Administered 2023-09-01: 5 mL via INTRADERMAL

## 2023-09-01 NOTE — ED Notes (Signed)
Pt states she is hot  wanted to lie down  She laid down on floor  family at bedside  Denies any pain

## 2023-09-01 NOTE — Discharge Instructions (Addendum)
Your stitches will need to be removed in 7 to 10 days.  This can be done by this ED, urgent care or your primary care provider.  Please leave the dressings intact for 24 hours.  After that you can wash them with soap and water and then recover with gauze and bandage.  Please keep them covered until stitches come out.  Watch for signs infection including redness, warmth, swelling, pain and purulent discharge.  If you develop any of these please return to the ED, urgent care or your primary care provider.  Please take the antibiotics as prescribed. You can take 650 mg of Tylenol and 600 mg of ibuprofen every 6 hours as needed for pain.

## 2023-09-01 NOTE — ED Notes (Signed)
Feels better.
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
## 2023-09-02 ENCOUNTER — Telehealth: Payer: Self-pay | Admitting: Emergency Medicine

## 2023-09-02 MED ORDER — CEPHALEXIN 500 MG PO CAPS: 500.0000 mg | ORAL_CAPSULE | Freq: Four times a day (QID) | ORAL | 0 refills | Status: AC

## 2023-09-02 NOTE — Telephone Encounter (Signed)
Resent rx

## 2023-09-05 ENCOUNTER — Other Ambulatory Visit: Payer: Self-pay

## 2023-09-05 ENCOUNTER — Emergency Department
Admission: EM | Admit: 2023-09-05 | Discharge: 2023-09-05 | Disposition: A | Discharge status: Home / Self Care | Payer: 59 | Attending: Emergency Medicine | Admitting: Emergency Medicine

## 2023-09-05 DIAGNOSIS — S6991XD Unspecified injury of right wrist, hand and finger(s), subsequent encounter: Secondary | ICD-10-CM | POA: Diagnosis present

## 2023-09-05 DIAGNOSIS — S61210D Laceration without foreign body of right index finger without damage to nail, subsequent encounter: Secondary | ICD-10-CM | POA: Insufficient documentation

## 2023-09-05 DIAGNOSIS — W25XXXD Contact with sharp glass, subsequent encounter: Secondary | ICD-10-CM | POA: Insufficient documentation

## 2023-09-05 DIAGNOSIS — Z4800 Encounter for change or removal of nonsurgical wound dressing: Secondary | ICD-10-CM | POA: Diagnosis not present

## 2023-09-05 DIAGNOSIS — Z4889 Encounter for other specified surgical aftercare: Secondary | ICD-10-CM

## 2023-09-05 NOTE — Discharge Instructions (Addendum)
Return in 3-5 days for suture removal.  Continue to take your antibiotics until prescription is complete.  Monitor for signs of infections as discussed.

## 2023-09-05 NOTE — ED Provider Notes (Signed)
[facility] Emergency Department Provider Note     Event Date/Time   First MD Initiated Contact with Patient 09/05/23 1116     (approximate)   History   Letter for School/Work   HPI  Kari Sweeney is a 32 y.o. female presents to the ED for a work note.  Patient was seen in this ED on 9/26 for laceration to her right index finger and right forearm after punching a glass door.  Patient denies fever and chills.  She is here requesting for a work note given her injury.  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  No other complaints.  Physical Exam   Triage Vital Signs: ED Triage Vitals  Encounter Vitals Group     BP 09/05/23 1107 (!) 135/104     Systolic BP Percentile --      Diastolic BP Percentile --      Pulse Rate 09/05/23 1107 86     Resp 09/05/23 1107 18     Temp 09/05/23 1106 98.3 F (36.8 C)     Temp src --      SpO2 09/05/23 1107 100 %     Weight 09/05/23 1107 229 lb 4.5 oz (104 kg)     Height 09/05/23 1107 5\' 7"  (1.702 m)     Head Circumference --      Peak Flow --      Pain Score 09/05/23 1107 0     Pain Loc --      Pain Education --      Exclude from Growth Chart --     Most recent vital signs: Vitals:   09/05/23 1106 09/05/23 1107  BP:  (!) 135/104  Pulse:  86  Resp:  18  Temp: 98.3 F (36.8 C)   SpO2:  100%    General Awake, no distress.  HEENT NCAT. PERRL. EOMI. No rhinorrhea. Mucous membranes are moist.  CV:  Good peripheral perfusion.  RESP:  Normal effort.  ABD:  No distention.  Other:  Sutures on right forearm and right index finger. No signs redness, swelling, or warmth.  No dehiscence.    ED Results / Procedures / Treatments   Labs (all labs ordered are listed, but only abnormal results are displayed) Labs Reviewed - No data to display  No results found.  PROCEDURES:  Critical Care performed:  No  Procedures   MEDICATIONS ORDERED IN ED: Medications - No data to display   IMPRESSION / MDM / ASSESSMENT AND PLAN / ED COURSE  I reviewed the triage vital signs and the nursing notes.                                32 y.o. female presents to the emergency department for evaluation and treatment of suture check following laceration repair.    Differential diagnosis includes, but is not limited to dehiscence, Cellulitis.  Patient's presentation is most consistent with acute complicated illness / injury requiring diagnostic workup.  Laceration is healing well.  Patient is educated on continuous laceration care.  She is encouraged to return in 3 to 5 days for suture removal.  A work note is provided.  I do think it is reasonable for that patient is limited with work duties at this time to optimize healing of the area.  Infection precautions are  discussed.  Patient is in stable condition for discharge home.  FINAL CLINICAL IMPRESSION(S) / ED DIAGNOSES   Final diagnoses:  Laceration of right index finger without foreign body without damage to nail, subsequent encounter  Suture check    Rx / DC Orders   ED Discharge Orders     None       Note:  This document was prepared using Dragon voice recognition software and may include unintentional dictation errors.    Romeo Apple, Ayzia Day A, PA-C 09/05/23 1153    Minna Antis, MD 09/05/23 1254

## 2023-09-05 NOTE — ED Triage Notes (Signed)
Pt states was here on 9/26 for injury to right hand. States work will not let her work and needs another work note.

## 2023-09-09 ENCOUNTER — Encounter: Payer: Self-pay | Admitting: Emergency Medicine

## 2023-09-09 ENCOUNTER — Other Ambulatory Visit: Payer: Self-pay

## 2023-09-09 ENCOUNTER — Emergency Department
Admission: EM | Admit: 2023-09-09 | Discharge: 2023-09-09 | Disposition: A | Discharge status: Home / Self Care | Payer: 59 | Attending: Emergency Medicine | Admitting: Emergency Medicine

## 2023-09-09 DIAGNOSIS — Z4802 Encounter for removal of sutures: Secondary | ICD-10-CM | POA: Insufficient documentation

## 2023-09-09 DIAGNOSIS — Z5189 Encounter for other specified aftercare: Secondary | ICD-10-CM

## 2023-09-09 NOTE — Discharge Instructions (Signed)
Your sutures were removed.  The Steri-Strips will fall off on their own.  Please return for any new, worsening, or change in symptoms or other concerns.  It was a pleasure caring for you today.

## 2023-09-09 NOTE — ED Provider Notes (Signed)
[facility] Provider Note    Event Date/Time   First MD Initiated Contact with Patient 09/09/23 1235     (approximate)   History   Suture / Staple Removal   HPI  Kari Sweeney is a 32 y.o. female who presents today with request for suture removal.  She has not had any problems.  She has not noticed any skin changes.  No fevers or chills.  No pain.  There are no problems to display for this patient.         Physical Exam   Triage Vital Signs: ED Triage Vitals  Encounter Vitals Group     BP 09/09/23 1207 (!) 135/93     Systolic BP Percentile --      Diastolic BP Percentile --      Pulse Rate 09/09/23 1207 82     Resp 09/09/23 1207 16     Temp 09/09/23 1206 98.4 F (36.9 C)     Temp Source 09/09/23 1206 Oral     SpO2 09/09/23 1207 100 %     Weight --      Height --      Head Circumference --      Peak Flow --      Pain Score 09/09/23 1206 0     Pain Loc --      Pain Education --      Exclude from Growth Chart --     Most recent vital signs: Vitals:   09/09/23 1206 09/09/23 1207  BP:  (!) 135/93  Pulse:  82  Resp:  16  Temp: 98.4 F (36.9 C)   SpO2:  100%    Physical Exam Vitals and nursing note reviewed.  Constitutional:      General: Awake and alert. No acute distress.    Appearance: Normal appearance. The patient is normal weight.  HENT:     Head: Normocephalic and atraumatic.     Mouth: Mucous membranes are moist.  Eyes:     General: PERRL. Normal EOMs        Right eye: No discharge.        Left eye: No discharge.     Conjunctiva/sclera: Conjunctivae normal.  Cardiovascular:     Rate and Rhythm: Normal rate and regular rhythm.     Pulses: Normal pulses.  Pulmonary:     Effort: Pulmonary effort is normal. No respiratory distress.     Breath sounds: Normal breath sounds.  Abdominal:     Abdomen is soft. There is no abdominal tenderness. No rebound or guarding. No distention. Musculoskeletal:        General: No  swelling. Normal range of motion.     Cervical back: Normal range of motion and neck supple.  Skin:    General: Skin is warm and dry.     Capillary Refill: Capillary refill takes less than 2 seconds.     Findings: Intact sutures to right forearm and fingers, appear to be healing well.  There is no surrounding erythema, no fluctuance. Neurological:     Mental Status: The patient is awake and alert.      ED Results / Procedures / Treatments   Labs (all labs ordered are listed, but only abnormal results are displayed) Labs Reviewed - No data to display   EKG     RADIOLOGY     PROCEDURES:  Critical Care performed:   .Suture Removal  Date/Time: 09/09/2023 1:34 PM  Performed by: Jackelyn Hoehn, PA-C  Authorized by: Jackelyn Hoehn, PA-C   Consent:    Consent obtained:  Verbal   Consent given by:  Patient   Risks, benefits, and alternatives were discussed: yes     Risks discussed:  Bleeding, pain and wound separation   Alternatives discussed:  No treatment Universal protocol:    Procedure explained and questions answered to patient or proxy's satisfaction: yes     Relevant documents present and verified: yes     Test results available: yes     Required blood products, implants, devices, and special equipment available: yes     Site/side marked: yes     Immediately prior to procedure, a time out was called: yes     Patient identity confirmed:  Verbally with patient Location:    Location:  Upper extremity   Upper extremity location:  Hand   Hand location:  R index finger Procedure details:    Wound appearance:  No signs of infection, good wound healing, clean, nonpurulent and nontender   Number of sutures removed:  3   Number of staples removed:  0 Post-procedure details:    Post-removal:  Antibiotic ointment applied   Procedure completion:  Tolerated well, no immediate complications .Suture Removal  Date/Time: 09/09/2023 1:35 PM  Performed by: Jackelyn Hoehn,  PA-C Authorized by: Jackelyn Hoehn, PA-C   Consent:    Consent obtained:  Verbal   Consent given by:  Patient   Risks, benefits, and alternatives were discussed: yes     Risks discussed:  Bleeding, pain and wound separation   Alternatives discussed:  No treatment Universal protocol:    Procedure explained and questions answered to patient or proxy's satisfaction: yes     Relevant documents present and verified: yes     Test results available: yes     Required blood products, implants, devices, and special equipment available: yes     Site/side marked: yes     Immediately prior to procedure, a time out was called: yes     Patient identity confirmed:  Verbally with patient Location:    Location:  Upper extremity   Upper extremity location:  Hand   Hand location:  R long finger Procedure details:    Wound appearance:  No signs of infection, good wound healing, clean and nontender   Number of sutures removed:  4   Number of staples removed:  0 Post-procedure details:    Post-removal:  No dressing applied   Procedure completion:  Tolerated well, no immediate complications .Suture Removal  Date/Time: 09/09/2023 1:36 PM  Performed by: Jackelyn Hoehn, PA-C Authorized by: Jackelyn Hoehn, PA-C   Consent:    Consent obtained:  Verbal   Consent given by:  Patient   Risks, benefits, and alternatives were discussed: yes     Risks discussed:  Bleeding   Alternatives discussed:  No treatment Universal protocol:    Procedure explained and questions answered to patient or proxy's satisfaction: yes     Relevant documents present and verified: yes     Test results available: yes     Required blood products, implants, devices, and special equipment available: yes     Site/side marked: yes     Immediately prior to procedure, a time out was called: yes     Patient identity confirmed:  Verbally with patient Location:    Location:  Upper extremity   Upper extremity location:  Arm   Arm  location:  R lower arm Procedure details:  Wound appearance:  No signs of infection, good wound healing and clean   Number of sutures removed:  5 Post-procedure details:    Post-removal:  Steri-Strips applied   Procedure completion:  Tolerated well, no immediate complications .Suture Removal  Date/Time: 09/09/2023 1:38 PM  Performed by: Jackelyn Hoehn, PA-C Authorized by: Jackelyn Hoehn, PA-C   Consent:    Consent obtained:  Verbal   Consent given by:  Patient   Risks, benefits, and alternatives were discussed: yes     Risks discussed:  Bleeding, pain and wound separation   Alternatives discussed:  No treatment Universal protocol:    Procedure explained and questions answered to patient or proxy's satisfaction: yes     Relevant documents present and verified: yes     Test results available: yes     Required blood products, implants, devices, and special equipment available: yes     Site/side marked: yes     Immediately prior to procedure, a time out was called: yes     Patient identity confirmed:  Verbally with patient Location:    Location:  Upper extremity   Upper extremity location:  Arm   Arm location:  R lower arm Procedure details:    Wound appearance:  No signs of infection, good wound healing and clean   Number of sutures removed:  4 Post-procedure details:    Post-removal:  Steri-Strips applied   Procedure completion:  Tolerated well, no immediate complications .Suture Removal  Date/Time: 09/09/2023 1:39 PM  Performed by: Jackelyn Hoehn, PA-C Authorized by: Jackelyn Hoehn, PA-C   Consent:    Consent obtained:  Verbal   Consent given by:  Patient   Risks, benefits, and alternatives were discussed: yes     Risks discussed:  Bleeding, pain and wound separation   Alternatives discussed:  No treatment Universal protocol:    Procedure explained and questions answered to patient or proxy's satisfaction: yes     Relevant documents present and verified: yes      Test results available: yes     Required blood products, implants, devices, and special equipment available: yes     Site/side marked: yes     Immediately prior to procedure, a time out was called: yes     Patient identity confirmed:  Verbally with patient Location:    Location:  Upper extremity   Upper extremity location:  Arm   Arm location:  R lower arm Procedure details:    Wound appearance:  No signs of infection, good wound healing and clean   Number of sutures removed:  4 Post-procedure details:    Post-removal:  Steri-Strips applied   Procedure completion:  Tolerated well, no immediate complications    MEDICATIONS ORDERED IN ED: Medications - No data to display   IMPRESSION / MDM / ASSESSMENT AND PLAN / ED COURSE  I reviewed the triage vital signs and the nursing notes.   Differential diagnosis includes, but is not limited to, suture removal, wound check, infection, dehiscence.  I reviewed the patient's chart.  Patient was seen on 9/26 after she put her arm through a glass window.  She sustained multiple lacerations at that time.  Patient is awake alert, hemodynamically stable and afebrile.  She denies any complaints with her sutures.  She has not noticed any skin discoloration or drainage.  There is no evidence of infection on exam.  The sutures were removed by Mason,PA-S who placed the sutures.  There was a small amount of wound  dehiscence in the center of the 2 largest lacerations and Steri-Strips were placed in these locations.  Advised that these will fall off on their own and she should not pick at them.  Discussed return precautions and outpatient follow-up.  Patient understands and agrees with plan.  She was discharged in stable condition.  Patient's presentation is most consistent with acute complicated illness / injury requiring diagnostic workup.   FINAL CLINICAL IMPRESSION(S) / ED DIAGNOSES   Final diagnoses:  Visit for suture removal  Visit for wound check      Rx / DC Orders   ED Discharge Orders     None        Note:  This document was prepared using Dragon voice recognition software and may include unintentional dictation errors.   Jackelyn Hoehn, PA-C 09/09/23 1345    Janith Lima, MD 09/09/23 [phone]

## 2023-09-09 NOTE — ED Notes (Signed)
See triage note  Presents for suture removal  Sutures intact to right arm and hand

## 2023-09-09 NOTE — ED Triage Notes (Signed)
Pt here for suture removal of right hand and forearm

## 2023-11-15 ENCOUNTER — Ambulatory Visit: Payer: 59

## 2023-11-17 ENCOUNTER — Ambulatory Visit
Admission: RE | Admit: 2023-11-17 | Discharge: 2023-11-17 | Disposition: A | Discharge status: Home / Self Care | Payer: 59 | Source: Ambulatory Visit

## 2023-11-17 VITALS — BP 131/85 | HR 79 | Temp 98.9°F | Resp 18

## 2023-11-17 DIAGNOSIS — K625 Hemorrhage of anus and rectum: Secondary | ICD-10-CM

## 2023-11-17 DIAGNOSIS — R1030 Lower abdominal pain, unspecified: Secondary | ICD-10-CM

## 2023-11-17 NOTE — ED Provider Notes (Signed)
Patient complains of a 2-week history of intermittent episodes of rectal bleeding, even when not moving her bowels.  Patient states the blood is dark red in color.  Patient states she has been having lower abdominal pain as well but the pain does not always correlate with episodes of rectal bleeding.  Patient states she is never had similar symptoms in the past.  Patient advised she will likely need imaging of her abdomen which we cannot perform here urgent care.  Recommend patient go to the emergency room now for further evaluation.  Vital signs stable on arrival, patient stable for discharge to ED via private vehicle driven by the person accompanying her today.   Theadora Rama Scales, PA-C 11/17/23 1053

## 2023-11-17 NOTE — ED Notes (Signed)
Patient is being discharged from the Urgent Care and sent to the Emergency Department via [facility] . Per provider Huston Foley., patient is in need of higher level of care due to abdominal pain and rectal bleeding needing further testing. Patient is aware and verbalizes understanding of plan of care.  Vitals:   11/17/23 1045  BP: 131/85  Pulse: 79  Resp: 18  Temp: 98.9 F (37.2 C)  SpO2: 97%

## 2023-11-17 NOTE — ED Triage Notes (Signed)
Triaged by provider  

## 2023-12-13 NOTE — Progress Notes (Signed)
 Ellouise Console, PA-C [address]  [address]  [place], [place] [address]  Main: [phone]  Fax: [phone]   Gastroenterology Consultation  Referring Provider:     No ref. provider found Primary Care Physician:  Patient, No Pcp Per Primary Gastroenterologist:  Ellouise Console, PA-C  Reason for Consultation:     Abdominal pain, Rectal Bleeding        HPI:   Kari Sweeney is a 33 y.o. y/o female referred for consultation & management  by Patient, No Pcp Per.    Patient went to [facility] urgent care 11/17/2023 to evaluate 2-week history of rectal bleeding and lower abdominal cramping.  She was referred to ED for further evaluation and treatment, however patient never went to the ED.  No recent labs or abdominal imaging.  No previous GI evaluation or colonoscopy.  Patient states he has had persistent rectal bleeding for 2 months.  She notices dark red blood on the tissue and in the toilet bowl.  Has intermittent lower abdominal cramping on and off for 1 month.  LLQ pain greater than RLQ pain.  She is having normal formed bowel movement 2-3 times per day.  She denies diarrhea, constipation, nausea, vomiting, or weight loss.  No family history of colon cancer or IBD.  Last abdominal pelvic CT in 2017, to evaluate abdominal pain, showed 7.7 cm right adrenal lesion.  No bowel inflammation.  He has not had repeat imaging since then.  Past Medical History:  Diagnosis Date   Adrenal cyst (HCC)    Anemia     Past Surgical History:  Procedure Laterality Date   CESAREAN SECTION      Prior to Admission medications   Not on File    History reviewed. No pertinent family history.   Social History   Tobacco Use   Smoking status: Some Days    Current packs/day: 0.25    Types: Cigarettes   Smokeless tobacco: Never  Substance Use Topics   Alcohol use: Yes    Alcohol/week: 1.0 standard drink of alcohol    Types: 1 Cans of beer per week    Comment: occas.    Drug use: No     Allergies as of 12/14/2023   (No Known Allergies)    Review of Systems:    All systems reviewed and negative except where noted in HPI.   Physical Exam:  BP (!) 140/93   Pulse 75   Temp 98.3 F (36.8 C)   Ht 5' 7 (1.702 m)   Wt 254 lb (115.2 kg)   BMI 39.78 kg/m  No LMP recorded.  General:   Alert,  Well-developed, well-nourished, pleasant and cooperative in NAD Lungs:  Respirations even and unlabored.  Clear throughout to auscultation.   No wheezes, crackles, or rhonchi. No acute distress. Heart:  Regular rate and rhythm; no murmurs, clicks, rubs, or gallops. Abdomen:  Normal bowel sounds.  No bruits.  Soft, and non-distended without masses, hepatosplenomegaly or hernias noted.  Moderate Bilateral lower abdominal Tenderness.  No upper abdominal tenderness.  No guarding or rebound tenderness.    Neurologic:  Alert and oriented x3;  grossly normal neurologically. Psych:  Alert and cooperative. Normal mood and affect.  Imaging Studies: No results found.  Assessment and Plan:   Kari Sweeney is a 33 y.o. y/o female has been referred for:   1.  Abdominal pain 2.  Rectal bleeding  I am concerned about inflammatory bowel disease.  I am Starting  her evaluation with labs and CT.  Pending results, then we can decide about scheduling a Colonoscopy.  Plan: -Labs: CBC, CMP, CRP, hCG -CT abdomen pelvis with contrast   Follow up 3 weeks to discuss scheduling colonoscopy.  Ellouise Console, PA-C

## 2023-12-14 ENCOUNTER — Encounter: Payer: Self-pay | Admitting: Physician Assistant

## 2023-12-14 ENCOUNTER — Ambulatory Visit (INDEPENDENT_AMBULATORY_CARE_PROVIDER_SITE_OTHER): Payer: 59 | Admitting: Physician Assistant

## 2023-12-14 VITALS — BP 140/93 | HR 75 | Temp 98.3°F | Ht 67.0 in | Wt 254.0 lb

## 2023-12-14 DIAGNOSIS — R109 Unspecified abdominal pain: Secondary | ICD-10-CM

## 2023-12-14 DIAGNOSIS — K625 Hemorrhage of anus and rectum: Secondary | ICD-10-CM | POA: Diagnosis not present

## 2023-12-14 DIAGNOSIS — R1084 Generalized abdominal pain: Secondary | ICD-10-CM

## 2023-12-14 NOTE — Patient Instructions (Signed)
 CT scan schedule 12/19/23 @ [facility] ariive at 8:45 am .

## 2023-12-15 ENCOUNTER — Telehealth: Payer: Self-pay

## 2023-12-15 LAB — CBC WITH DIFFERENTIAL/PLATELET
Basophils Absolute: 0.1 10*3/uL (ref 0.0–0.2)
Basos: 1 %
EOS (ABSOLUTE): 0.3 10*3/uL (ref 0.0–0.4)
Eos: 5 %
Hematocrit: 35.6 % (ref 34.0–46.6)
Hemoglobin: 10.4 g/dL — ABNORMAL LOW (ref 11.1–15.9)
Immature Grans (Abs): 0 10*3/uL (ref 0.0–0.1)
Immature Granulocytes: 0 %
Lymphocytes Absolute: 2 10*3/uL (ref 0.7–3.1)
Lymphs: 31 %
MCH: 23.5 pg — ABNORMAL LOW (ref 26.6–33.0)
MCHC: 29.2 g/dL — ABNORMAL LOW (ref 31.5–35.7)
MCV: 81 fL (ref 79–97)
Monocytes Absolute: 0.4 10*3/uL (ref 0.1–0.9)
Monocytes: 6 %
Neutrophils Absolute: 3.7 10*3/uL (ref 1.4–7.0)
Neutrophils: 57 %
Platelets: 342 10*3/uL (ref 150–450)
RBC: 4.42 x10E6/uL (ref 3.77–5.28)
RDW: 16.4 % — ABNORMAL HIGH (ref 11.7–15.4)
WBC: 6.5 10*3/uL (ref 3.4–10.8)

## 2023-12-15 LAB — COMPREHENSIVE METABOLIC PANEL
ALT: 13 [IU]/L (ref 0–32)
AST: 15 [IU]/L (ref 0–40)
Albumin: 4.1 g/dL (ref 3.9–4.9)
Alkaline Phosphatase: 79 [IU]/L (ref 44–121)
BUN/Creatinine Ratio: 11 (ref 9–23)
BUN: 9 mg/dL (ref 6–20)
Bilirubin Total: 0.2 mg/dL (ref 0.0–1.2)
CO2: 21 mmol/L (ref 20–29)
Calcium: 9.2 mg/dL (ref 8.7–10.2)
Chloride: 106 mmol/L (ref 96–106)
Creatinine, Ser: 0.82 mg/dL (ref 0.57–1.00)
Globulin, Total: 2.9 g/dL (ref 1.5–4.5)
Glucose: 88 mg/dL (ref 70–99)
Potassium: 4.5 mmol/L (ref 3.5–5.2)
Sodium: 138 mmol/L (ref 134–144)
Total Protein: 7 g/dL (ref 6.0–8.5)
eGFR: 97 mL/min/{1.73_m2} (ref >59)

## 2023-12-15 LAB — C-REACTIVE PROTEIN: CRP: <1 mg/L (ref 0–10)

## 2023-12-15 LAB — HCG, SERUM, QUALITATIVE: hCG,Beta Subunit,Qual,Serum: NEGATIVE m[IU]/mL (ref <6)

## 2023-12-15 NOTE — Progress Notes (Signed)
 Labs show: 1.  Mild anemia with a low hemoglobin 10.4.  **Please call lab and add on iron  panel, ferritin, and vitamin B12.  Diagnosis anemia, unspecified. 2.  White count is normal.  No evidence of infection.  CRP is normal.  No evidence of inflammation. 3.  Glucose, kidney test, and liver test are normal. 4.  Pregnancy test is negative. **Continue with current plan for abdominal pelvic CT.

## 2023-12-15 NOTE — Telephone Encounter (Signed)
 Patient notified- I let her know I have added some additional test- but if [facility] is unable to do so due to not having enough blood she may need to come back to office for lab work.    1.  Mild anemia with a low hemoglobin 10.4.  **Please call lab and add on iron  panel, ferritin, and vitamin B12.  Diagnosis anemia, unspecified.  2.  White count is normal.  No evidence of infection.  CRP is normal.  No evidence of inflammation.  3.  Glucose, kidney test, and liver test are normal.  4.  Pregnancy test is negative.  **Continue with current plan for abdominal pelvic CT.

## 2023-12-16 LAB — VITAMIN B12: Vitamin B-12: 408 pg/mL (ref 232–1245)

## 2023-12-16 LAB — IRON AND TIBC
Iron Saturation: 6 % — CL (ref 15–55)
Iron: 26 ug/dL — ABNORMAL LOW (ref 27–159)
Total Iron Binding Capacity: 469 ug/dL — ABNORMAL HIGH (ref 250–450)
UIBC: 443 ug/dL — ABNORMAL HIGH (ref 131–425)

## 2023-12-16 LAB — FERRITIN: Ferritin: 8 ng/mL — ABNORMAL LOW (ref 15–150)

## 2023-12-16 LAB — SPECIMEN STATUS REPORT

## 2023-12-19 ENCOUNTER — Ambulatory Visit
Admission: RE | Admit: 2023-12-19 | Discharge: 2023-12-19 | Disposition: A | Discharge status: Home / Self Care | Payer: 59 | Source: Ambulatory Visit | Attending: Physician Assistant | Admitting: Physician Assistant

## 2023-12-19 DIAGNOSIS — R1084 Generalized abdominal pain: Secondary | ICD-10-CM | POA: Insufficient documentation

## 2023-12-19 MED ORDER — IOHEXOL 300 MG/ML  SOLN
100.0000 mL | Freq: Once | INTRAMUSCULAR | Status: AC | PRN
  Administered 2023-12-19: 100 mL via INTRAVENOUS

## 2023-12-26 ENCOUNTER — Telehealth: Payer: Self-pay

## 2023-12-26 ENCOUNTER — Other Ambulatory Visit: Payer: Self-pay

## 2023-12-26 DIAGNOSIS — K625 Hemorrhage of anus and rectum: Secondary | ICD-10-CM

## 2023-12-26 NOTE — Progress Notes (Signed)
Call and notify pt. Abd pelvic CT shows: 1.  Both adrenal glands are normal.  No masses. 2.  There is wall thickening of urinary bladder.  She needs to go to urgent care to check urine for possible UTI. 3.  Mild wall thickening of the transverse colon, consistent with nonspecific colitis. 4.  Evidence of uterine fibroids.  She needs to follow-up with gynecologist for pelvic ultrasound in the future - not urgent. **Please go ahead and hold date for colonoscopy (after 01/09/24), any MD, [facility].  Diagnosis rectal bleeding and abnormal CT of the colon. **Ask if patient is having diarrhea?  If so, order stool studies to check C. difficile toxin PCR and GI pathogen panel. **Keep f/u OV with me 01/09/24.  We can go over colonoscopy instruction at her OV.

## 2023-12-26 NOTE — Telephone Encounter (Signed)
Left message to return call to office...   Call and notify pt. Abd pelvic CT shows:  1.  Both adrenal glands are normal.  No masses.  2.  There is wall thickening of urinary bladder.  She needs to go to urgent care to check urine for possible UTI.  3.  Mild wall thickening of the transverse colon, consistent with nonspecific colitis.  4.  Evidence of uterine fibroids.  She needs to follow-up with gynecologist for pelvic ultrasound in the future - not urgent.  **Please go ahead and hold date for colonoscopy (after 01/09/24), any MD, [facility].  Diagnosis rectal bleeding and abnormal CT of the colon.  **Ask if patient is having diarrhea?  If so, order stool studies to check C. difficile toxin PCR and GI pathogen panel.  **Keep f/u OV with me 01/09/24.  We can go over colonoscopy instruction at her OV.

## 2023-12-26 NOTE — Telephone Encounter (Signed)
Spoke with patient she would like her Colonoscopy scheduled 01/31/24 with Dr.Anna- Gol[facility] prep-instructions mailed to patient- patient stated she is not having diarrhea.

## 2024-01-08 NOTE — Progress Notes (Unsigned)
Celso Amy, PA-C [address]  [address]  [place], [place] [address]  Main: [phone]  Fax: [phone]   Primary Care Physician: Pcp, No  Primary Gastroenterologist:  Celso Amy, PA-C / Dr. Wyline Mood    CC: Follow-up abdominal pain, rectal bleeding and iron deficiency anemia.  HPI: Kari Sweeney is a 33 y.o. female returns for 3-week follow-up of abdominal pain, rectal bleeding, and iron deficiency anemia.  Last episode of rectal bleeding occurred 2 weeks ago.  She did NOT start taking iron.  She admits to menorrhagia.  No recent GYN eval.  She continues to have intermittent lower abdominal cramping which is worse with bending over.  Bowel movements have returned to normal.  She is not having any current diarrhea, constipation, nausea, vomiting, or weight loss.  No family history of IBD or colon cancer.  She has never had an EGD or colonoscopy  12/14/2023 labs: Showed mild anemia with hemoglobin 10.4.  Low iron.  Normal B12.  Normal CMP and CRP.  Pregnancy test negative.  She was recommended to start on ferrous sulfate 325 mg once daily, but has not yet started.  12/19/2023 CT abdomen pelvis with contrast: Mild wall thickening of the transverse colon, nonspecific colitis.  Bladder wall thickening, possible cystitis (advised to follow-up with PCP).  Uterine fibroids (advised to follow-up with GYN).   Current Outpatient Medications  Medication Sig Dispense Refill   Iron-Vitamin C 100-250 MG TABS Take 1 tablet by mouth daily. 30 tablet 2   No current facility-administered medications for this visit.    Allergies as of 01/09/2024   (No Known Allergies)    Past Medical History:  Diagnosis Date   Adrenal cyst (HCC)    Anemia    Iron deficiency anemia    Menorrhagia     Past Surgical History:  Procedure Laterality Date   CESAREAN SECTION      Review of Systems:    All systems reviewed and negative except where noted in HPI.   Physical Examination:   BP  138/80   Pulse 86   Temp 98 F (36.7 C)   Ht 5\' 7"  (1.702 m)   Wt 254 lb (115.2 kg)   BMI 39.78 kg/m   General: Well-nourished, well-developed in no acute distress.  Lungs: Clear to auscultation bilaterally. Non-labored. Heart: Regular rate and rhythm, no murmurs rubs or gallops.  Abdomen: Bowel sounds are normal; Abdomen is Soft; No hepatosplenomegaly, masses or hernias;  Mild RLQ Abdominal Tenderness; Rest of abdomen is not tender.  No guarding or rebound tenderness. Neuro: Alert and oriented x 3.  Grossly intact.  Psych: Alert and cooperative, normal mood and affect.   Imaging Studies: CT ABDOMEN PELVIS W CONTRAST Result Date: 12/24/2023 CLINICAL DATA:  Two week history of abdominal pain. EXAM: CT ABDOMEN AND PELVIS WITH CONTRAST TECHNIQUE: Multidetector CT imaging of the abdomen and pelvis was performed using the standard protocol following bolus administration of intravenous contrast. RADIATION DOSE REDUCTION: This exam was performed according to the departmental dose-optimization program which includes automated exposure control, adjustment of the mA and/or kV according to patient size and/or use of iterative reconstruction technique. CONTRAST:  OMNIPAQUE IOHEXOL 300 MG/ML  SOLN COMPARISON:  CT January 05, 2016 FINDINGS: Lower chest: No acute abnormality. Hepatobiliary: No suspicious hepatic lesion. Gallbladder is unremarkable. No biliary ductal dilation. Pancreas: No pancreatic ductal dilation or evidence of acute inflammation. Spleen: No splenomegaly. Adrenals/Urinary Tract: Bilateral adrenal glands appear normal. No hydronephrosis. Kidneys demonstrate symmetric  enhancement. Symmetric wall thickening of a nondistended urinary bladder. Stomach/Bowel: Stomach is unremarkable for minimal distension. No pathologic dilation of small or large bowel. Mild wall thickening of the transverse colon with some adjacent fluid stranding. Normal appendix. Vascular/Lymphatic: Normal caliber abdominal  aorta. Smooth IVC contours. The portal, splenic and superior mesenteric veins are patent. No pathologically enlarged abdominal or pelvic lymph nodes. Reproductive: Lobular uterine contour may reflect leiomyomas. Other: Trace pelvic free fluid is within physiologic normal limits. Musculoskeletal: No acute osseous abnormality. IMPRESSION: 1. Mild wall thickening of the transverse colon with some adjacent fluid stranding, suggestive of a nonspecific colitis. 2. Symmetric wall thickening of a nondistended urinary bladder, correlate with urinalysis to exclude cystitis. 3. Lobular uterine contour may reflect leiomyomas. Consider further evaluation with pelvic ultrasound. Electronically Signed   By: Maudry Mayhew M.D.   On: 12/24/2023 09:39    Assessment and Plan:   Kari Sweeney is a 33 y.o. y/o female returns for follow-up of:  1.  Iron deficiency anemia  Scheduling EGD I discussed risks of EGD with patient to include risk of bleeding, perforation, and risk of sedation.  Patient expressed understanding and agrees to proceed with EGD.   Rx Iron with Vit C 1 tablet daily.  Repeat labs in 5 weeks at next OV.  2.  Abnormal CT of the colon: Nonspecific colitis in the transverse colon  Scheduling Colonoscopy I discussed risks of colonoscopy with patient to include risk of bleeding, colon perforation, and risk of sedation.  Patient expressed understanding and agrees to proceed with colonoscopy.   3.  Uterine fibroids, menorrhagia  She is instructed to Follow-up with GYN.  Celso Amy, PA-C  Follow up 5 weeks with TG.

## 2024-01-09 ENCOUNTER — Telehealth: Payer: Self-pay

## 2024-01-09 ENCOUNTER — Encounter: Payer: Self-pay | Admitting: Physician Assistant

## 2024-01-09 ENCOUNTER — Ambulatory Visit (INDEPENDENT_AMBULATORY_CARE_PROVIDER_SITE_OTHER): Payer: 59 | Admitting: Physician Assistant

## 2024-01-09 VITALS — BP 138/80 | HR 86 | Temp 98.0°F | Ht 67.0 in | Wt 254.0 lb

## 2024-01-09 DIAGNOSIS — D259 Leiomyoma of uterus, unspecified: Secondary | ICD-10-CM

## 2024-01-09 DIAGNOSIS — N920 Excessive and frequent menstruation with regular cycle: Secondary | ICD-10-CM | POA: Insufficient documentation

## 2024-01-09 DIAGNOSIS — R1084 Generalized abdominal pain: Secondary | ICD-10-CM

## 2024-01-09 DIAGNOSIS — D509 Iron deficiency anemia, unspecified: Secondary | ICD-10-CM | POA: Diagnosis not present

## 2024-01-09 DIAGNOSIS — R933 Abnormal findings on diagnostic imaging of other parts of digestive tract: Secondary | ICD-10-CM

## 2024-01-09 DIAGNOSIS — K529 Noninfective gastroenteritis and colitis, unspecified: Secondary | ICD-10-CM

## 2024-01-09 DIAGNOSIS — D649 Anemia, unspecified: Secondary | ICD-10-CM | POA: Insufficient documentation

## 2024-01-09 DIAGNOSIS — D500 Iron deficiency anemia secondary to blood loss (chronic): Secondary | ICD-10-CM

## 2024-01-09 MED ORDER — IRON-VITAMIN C 100-250 MG PO TABS: 1.0000 | ORAL_TABLET | Freq: Every day | ORAL | 2 refills | Status: AC

## 2024-01-09 NOTE — Telephone Encounter (Signed)
Spoke with [facility]- added EGD -IDA on 01/31/24.

## 2024-01-10 ENCOUNTER — Telehealth: Payer: Self-pay

## 2024-01-10 NOTE — Telephone Encounter (Signed)
error 

## 2024-01-30 ENCOUNTER — Other Ambulatory Visit: Payer: Self-pay

## 2024-01-30 MED ORDER — GOLYTELY 236 G PO SOLR: 4000.0000 mL | Freq: Once | ORAL | 0 refills | Status: AC

## 2024-01-30 NOTE — Progress Notes (Signed)
 Prep for Golytely not sent.  Pt at pharmacy now.  Rx for Golytely just sent to pharmacy for tomorrows procedure.  Thanks,  [place], [place]

## 2024-01-31 ENCOUNTER — Ambulatory Visit: Payer: 59 | Admitting: Anesthesiology

## 2024-01-31 ENCOUNTER — Encounter: Payer: Self-pay | Admitting: Gastroenterology

## 2024-01-31 ENCOUNTER — Ambulatory Visit
Admission: RE | Admit: 2024-01-31 | Discharge: 2024-01-31 | Disposition: A | Discharge status: Home / Self Care | Payer: 59 | Attending: Gastroenterology | Admitting: Gastroenterology

## 2024-01-31 ENCOUNTER — Encounter
Admission: RE | Disposition: A | Discharge status: Home / Self Care | Payer: Self-pay | Source: Home / Self Care | Attending: Gastroenterology

## 2024-01-31 DIAGNOSIS — K640 First degree hemorrhoids: Secondary | ICD-10-CM | POA: Insufficient documentation

## 2024-01-31 DIAGNOSIS — D509 Iron deficiency anemia, unspecified: Secondary | ICD-10-CM | POA: Insufficient documentation

## 2024-01-31 DIAGNOSIS — D128 Benign neoplasm of rectum: Secondary | ICD-10-CM | POA: Diagnosis not present

## 2024-01-31 DIAGNOSIS — E66813 Obesity, class 3: Secondary | ICD-10-CM | POA: Diagnosis not present

## 2024-01-31 DIAGNOSIS — K635 Polyp of colon: Secondary | ICD-10-CM | POA: Insufficient documentation

## 2024-01-31 DIAGNOSIS — F17210 Nicotine dependence, cigarettes, uncomplicated: Secondary | ICD-10-CM | POA: Insufficient documentation

## 2024-01-31 DIAGNOSIS — Z6841 Body Mass Index (BMI) 40.0 and over, adult: Secondary | ICD-10-CM | POA: Diagnosis not present

## 2024-01-31 DIAGNOSIS — K625 Hemorrhage of anus and rectum: Secondary | ICD-10-CM

## 2024-01-31 DIAGNOSIS — D126 Benign neoplasm of colon, unspecified: Secondary | ICD-10-CM

## 2024-01-31 HISTORY — PX: POLYPECTOMY: SHX5525

## 2024-01-31 HISTORY — PX: ESOPHAGOGASTRODUODENOSCOPY (EGD) WITH PROPOFOL: SHX5813

## 2024-01-31 HISTORY — PX: COLONOSCOPY WITH PROPOFOL: SHX5780

## 2024-01-31 HISTORY — PX: BIOPSY: SHX5522

## 2024-01-31 LAB — POCT PREGNANCY, URINE: Preg Test, Ur: NEGATIVE

## 2024-01-31 SURGERY — COLONOSCOPY WITH PROPOFOL
Anesthesia: General

## 2024-01-31 MED ORDER — PROPOFOL 10 MG/ML IV BOLUS
INTRAVENOUS | Status: DC | PRN
  Administered 2024-01-31: 30 mg via INTRAVENOUS
  Administered 2024-01-31: 100 mg via INTRAVENOUS
  Administered 2024-01-31: 30 mg via INTRAVENOUS

## 2024-01-31 MED ORDER — PROPOFOL 500 MG/50ML IV EMUL
INTRAVENOUS | Status: DC | PRN
  Administered 2024-01-31: 150 ug/kg/min via INTRAVENOUS

## 2024-01-31 MED ORDER — STERILE WATER FOR IRRIGATION IR SOLN
Status: DC | PRN
  Administered 2024-01-31: 120 mL

## 2024-01-31 MED ORDER — LIDOCAINE HCL (CARDIAC) PF 100 MG/5ML IV SOSY
PREFILLED_SYRINGE | INTRAVENOUS | Status: DC | PRN
  Administered 2024-01-31: 70 mg via INTRAVENOUS

## 2024-01-31 MED ORDER — SODIUM CHLORIDE 0.9 % IV SOLN: INTRAVENOUS | Status: DC

## 2024-01-31 NOTE — Anesthesia Preprocedure Evaluation (Signed)
 Anesthesia Evaluation  Patient identified by MRN, date of birth, ID band Patient awake    Reviewed: Allergy & Precautions, NPO status , Patient's Chart, lab work & pertinent test results  History of Anesthesia Complications Negative for: history of anesthetic complications  Airway Mallampati: III  TM Distance: >3 FB Neck ROM: full    Dental no notable dental hx.    Pulmonary neg pulmonary ROS, Current Smoker   Pulmonary exam normal        Cardiovascular negative cardio ROS Normal cardiovascular exam     Neuro/Psych negative neurological ROS  negative psych ROS   GI/Hepatic negative GI ROS, Neg liver ROS,,,  Endo/Other    Class 3 obesity  Renal/GU negative Renal ROS  negative genitourinary   Musculoskeletal   Abdominal   Peds  Hematology  (+) Blood dyscrasia, anemia   Anesthesia Other Findings Past Medical History: No date: Adrenal cyst (HCC) No date: Anemia No date: Iron deficiency anemia No date: Menorrhagia  Past Surgical History: No date: CESAREAN SECTION  BMI    Body Mass Index: 41.32 kg/m      Reproductive/Obstetrics negative OB ROS                             Anesthesia Physical Anesthesia Plan  ASA: 3  Anesthesia Plan: General   Post-op Pain Management: Minimal or no pain anticipated   Induction: Intravenous  PONV Risk Score and Plan: 2 and Propofol infusion and TIVA  Airway Management Planned: Natural Airway and Nasal Cannula  Additional Equipment:   Intra-op Plan:   Post-operative Plan:   Informed Consent: I have reviewed the patients History and Physical, chart, labs and discussed the procedure including the risks, benefits and alternatives for the proposed anesthesia with the patient or authorized representative who has indicated his/her understanding and acceptance.     Dental Advisory Given  Plan Discussed with: Anesthesiologist, CRNA and  Surgeon  Anesthesia Plan Comments: (Patient consented for risks of anesthesia including but not limited to:  - adverse reactions to medications - risk of airway placement if required - damage to eyes, teeth, lips or other oral mucosa - nerve damage due to positioning  - sore throat or hoarseness - Damage to heart, brain, nerves, lungs, other parts of body or loss of life  Patient voiced understanding and assent.)       Anesthesia Quick Evaluation

## 2024-01-31 NOTE — Anesthesia Postprocedure Evaluation (Signed)
 Anesthesia Post Note  Patient: Kari Sweeney  Procedure(s) Performed: COLONOSCOPY WITH PROPOFOL ESOPHAGOGASTRODUODENOSCOPY (EGD) WITH PROPOFOL BIOPSY POLYPECTOMY  Patient location during evaluation: Endoscopy Anesthesia Type: General Level of consciousness: awake and alert Pain management: pain level controlled Vital Signs Assessment: post-procedure vital signs reviewed and stable Respiratory status: spontaneous breathing, nonlabored ventilation, respiratory function stable and patient connected to nasal cannula oxygen Cardiovascular status: blood pressure returned to baseline and stable Postop Assessment: no apparent nausea or vomiting Anesthetic complications: no   No notable events documented.   Last Vitals:  Vitals:   01/31/24 1152 01/31/24 1202  BP: (!) 138/91 (!) 148/89  Pulse: 71 67  Resp: 18 18  Temp: (!) 36.1 C   SpO2: 100% 100%    Last Pain:  Vitals:   01/31/24 1202  TempSrc:   PainSc: 0-No pain                 Louie Boston

## 2024-01-31 NOTE — Transfer of Care (Signed)
 Immediate Anesthesia Transfer of Care Note  Patient: Kari Sweeney  Procedure(s) Performed: COLONOSCOPY WITH PROPOFOL ESOPHAGOGASTRODUODENOSCOPY (EGD) WITH PROPOFOL BIOPSY POLYPECTOMY  Patient Location: PACU and Endoscopy Unit  Anesthesia Type:General  Level of Consciousness: drowsy  Airway & Oxygen Therapy: Patient Spontanous Breathing  Post-op Assessment: Report given to RN and Post -op Vital signs reviewed and stable  Post vital signs: Reviewed and stable  Last Vitals:  Vitals Value Taken Time  BP 138/91 01/31/24 1152  Temp 36.1 C 01/31/24 1152  Pulse 66 01/31/24 1153  Resp 24 01/31/24 1153  SpO2 100 % 01/31/24 1153  Vitals shown include unfiled device data.  Last Pain:  Vitals:   01/31/24 1152  TempSrc: Temporal  PainSc: Asleep         Complications: No notable events documented.

## 2024-01-31 NOTE — Op Note (Signed)
 [facility] Patient Name: Kari Sweeney Procedure Date: 01/31/2024 11:18 AM MRN: [id number] Account #: [id number] Date of Birth: Nov 16, 1991 Admit Type: Outpatient Age: 33 Room: [facility] ENDO ROOM 1 Gender: Female Note Status: Finalized Instrument Name: Prentice Docker [id number] Procedure:             Colonoscopy Indications:           Iron deficiency anemia Providers:             Wyline Mood MD, MD Referring MD:          No Local Md, MD (Referring MD) Medicines:             Monitored Anesthesia Care Complications:         No immediate complications. Procedure:             Pre-Anesthesia Assessment:                        - Prior to the procedure, a History and Physical was                         performed, and patient medications, allergies and                         sensitivities were reviewed. The patient's tolerance                         of previous anesthesia was reviewed.                        - The risks and benefits of the procedure and the                         sedation options and risks were discussed with the                         patient. All questions were answered and informed                         consent was obtained.                        - ASA Grade Assessment: II - A patient with mild                         systemic disease.                        After obtaining informed consent, the colonoscope was                         passed under direct vision. Throughout the procedure,                         the patient's blood pressure, pulse, and oxygen                         saturations were monitored continuously. The                         Colonoscope was introduced through  the anus and                         advanced to the the cecum, identified by the                         appendiceal orifice. The colonoscopy was performed                         with ease. The patient tolerated the procedure well.                          The quality of the bowel preparation was excellent.                         The ileocecal valve, appendiceal orifice, and rectum                         were photographed. Findings:      The perianal and digital rectal examinations were normal.      An 8 mm polyp was found in the sigmoid colon. The polyp was sessile. The       polyp was removed with a cold snare. Resection and retrieval were       complete.      A 5 mm polyp was found in the rectum. The polyp was sessile. The polyp       was removed with a cold snare. Resection and retrieval were complete.      Non-bleeding internal hemorrhoids were found during retroflexion. The       hemorrhoids were medium-sized and Grade I (internal hemorrhoids that do       not prolapse).      The exam was otherwise without abnormality on direct and retroflexion       views. Impression:            - One 8 mm polyp in the sigmoid colon, removed with a                         cold snare. Resected and retrieved.                        - One 5 mm polyp in the rectum, removed with a cold                         snare. Resected and retrieved.                        - Non-bleeding internal hemorrhoids.                        - The examination was otherwise normal on direct and                         retroflexion views. Recommendation:        - Discharge patient to home (with escort).                        - Resume previous diet.                        -  Continue present medications.                        - Await pathology results.                        - Repeat colonoscopy for surveillance based on                         pathology results. Procedure Code(s):     --- Professional ---                        [phone], Colonoscopy, flexible; with removal of                         tumor(s), polyp(s), or other lesion(s) by snare                         technique Diagnosis Code(s):     --- Professional ---                        D12.5, Benign neoplasm of  sigmoid colon                        D12.8, Benign neoplasm of rectum                        K64.0, First degree hemorrhoids                        D50.9, Iron deficiency anemia, unspecified CPT copyright 2022 American Medical Association. All rights reserved. The codes documented in this report are preliminary and upon coder review may  be revised to meet current compliance requirements. Wyline Mood, MD Wyline Mood MD, MD 01/31/2024 11:50:25 AM This report has been signed electronically. Number of Addenda: 0 Note Initiated On: 01/31/2024 11:18 AM Scope Withdrawal Time: 0 hours 8 minutes 16 seconds  Total Procedure Duration: 0 hours 10 minutes 17 seconds  Estimated Blood Loss:  Estimated blood loss: none.      [facility]

## 2024-01-31 NOTE — H&P (Signed)
     Wyline Mood, MD [address], [address], [place], [place], [address] [address], [address], [place], [place], [address] Phone: [phone]  Fax: [phone]  Primary Care Physician:  Pcp, No   Pre-Procedure History & Physical: HPI:  Kari Sweeney is a 33 y.o. female is here for an endoscopy and colonoscopy    Past Medical History:  Diagnosis Date   Adrenal cyst (HCC)    Anemia    Iron deficiency anemia    Menorrhagia     Past Surgical History:  Procedure Laterality Date   CESAREAN SECTION      Prior to Admission medications   Medication Sig Start Date End Date Taking? Authorizing Provider  Iron-Vitamin C 100-250 MG TABS Take 1 tablet by mouth daily. 01/09/24 04/08/24  Celso Amy, PA-C    Allergies as of 12/26/2023   (No Known Allergies)    History reviewed. No pertinent family history.  Social History   Socioeconomic History   Marital status: Single    Spouse name: Not on file   Number of children: Not on file   Years of education: Not on file   Highest education level: Not on file  Occupational History   Not on file  Tobacco Use   Smoking status: Some Days    Current packs/day: 0.25    Types: Cigarettes   Smokeless tobacco: Never  Substance and Sexual Activity   Alcohol use: Yes    Alcohol/week: 1.0 standard drink of alcohol    Types: 1 Cans of beer per week    Comment: occas.    Drug use: No   Sexual activity: Yes    Birth control/protection: None  Other Topics Concern   Not on file  Social History Narrative   Not on file   Social Drivers of Health   Financial Resource Strain: Not on file  Food Insecurity: Not on file  Transportation Needs: Not on file  Physical Activity: Not on file  Stress: Not on file  Social Connections: Not on file  Intimate Partner Violence: Not on file    Review of Systems: See HPI, otherwise negative ROS  Physical Exam: BP 131/79   Pulse 75   Temp (!) 96.1 F (35.6 C) (Temporal)   Resp 16   Ht  5\' 6"  (1.676 m)   Wt 116.1 kg   SpO2 100%   BMI 41.32 kg/m  General:   Alert,  pleasant and cooperative in NAD Head:  Normocephalic and atraumatic. Neck:  Supple; no masses or thyromegaly. Lungs:  Clear throughout to auscultation, normal respiratory effort.    Heart:  +S1, +S2, Regular rate and rhythm, No edema. Abdomen:  Soft, nontender and nondistended. Normal bowel sounds, without guarding, and without rebound.   Neurologic:  Alert and  oriented x4;  grossly normal neurologically.  Impression/Plan: Kari Sweeney is here for an endoscopy and colonoscopy  to be performed for  evaluation of iron deficiency anemia    Risks, benefits, limitations, and alternatives regarding endoscopy have been reviewed with the patient.  Questions have been answered.  All parties agreeable.   Wyline Mood, MD  01/31/2024, 10:41 AM

## 2024-01-31 NOTE — Op Note (Signed)
 [facility] Patient Name: Kari Sweeney Procedure Date: 01/31/2024 11:18 AM MRN: [id number] Account #: [id number] Date of Birth: [birth year] Admit Type: Outpatient Age: 33 Room: [facility] ENDO ROOM 1 Gender: Female Note Status: Finalized Instrument Name: Upper Endoscope [id number] Procedure:             Upper GI endoscopy Indications:           Iron deficiency anemia Providers:             Wyline Mood MD, MD Referring MD:          No Local Md, MD (Referring MD) Medicines:             Monitored Anesthesia Care Complications:         No immediate complications. Procedure:             Pre-Anesthesia Assessment:                        - Prior to the procedure, a History and Physical was                         performed, and patient medications, allergies and                         sensitivities were reviewed. The patient's tolerance                         of previous anesthesia was reviewed.                        - The risks and benefits of the procedure and the                         sedation options and risks were discussed with the                         patient. All questions were answered and informed                         consent was obtained.                        - ASA Grade Assessment: II - A patient with mild                         systemic disease.                        - Prior to the procedure, a History and Physical was                         performed, and patient medications, allergies and                         sensitivities were reviewed. The patient's tolerance                         of previous anesthesia was reviewed.                        -  The risks and benefits of the procedure and the                         sedation options and risks were discussed with the                         patient. All questions were answered and informed                         consent was obtained.                        - ASA Grade  Assessment: II - A patient with mild                         systemic disease.                        After obtaining informed consent, the endoscope was                         passed under direct vision. Throughout the procedure,                         the patient's blood pressure, pulse, and oxygen                         saturations were monitored continuously. The Endoscope                         was introduced through the mouth, and advanced to the                         third part of duodenum. The upper GI endoscopy was                         accomplished with ease. The patient tolerated the                         procedure well. Findings:      The esophagus was normal.      The stomach was normal.      The cardia and gastric fundus were normal on retroflexion.      The examined duodenum was normal. Biopsies were taken with a cold       forceps for histology. Impression:            - Normal esophagus.                        - Normal stomach.                        - Normal examined duodenum. Biopsied. Recommendation:        - Await pathology results.                        - Perform a colonoscopy today. Procedure Code(s):     --- Professional ---  40981, Esophagogastroduodenoscopy, flexible,                         transoral; with biopsy, single or multiple Diagnosis Code(s):     --- Professional ---                        D50.9, Iron deficiency anemia, unspecified CPT copyright 2022 American Medical Association. All rights reserved. The codes documented in this report are preliminary and upon coder review may  be revised to meet current compliance requirements. Wyline Mood, MD Wyline Mood MD, MD 01/31/2024 11:36:11 AM This report has been signed electronically. Number of Addenda: 0 Note Initiated On: 01/31/2024 11:18 AM Estimated Blood Loss:  Estimated blood loss: none.      [facility]

## 2024-02-01 LAB — SURGICAL PATHOLOGY

## 2024-02-07 ENCOUNTER — Ambulatory Visit (INDEPENDENT_AMBULATORY_CARE_PROVIDER_SITE_OTHER): Payer: 59 | Admitting: Obstetrics and Gynecology

## 2024-02-07 ENCOUNTER — Encounter: Payer: Self-pay | Admitting: Gastroenterology

## 2024-02-07 ENCOUNTER — Encounter: Payer: Self-pay | Admitting: Obstetrics and Gynecology

## 2024-02-07 VITALS — BP 138/89 | HR 89 | Ht 66.0 in | Wt 251.9 lb

## 2024-02-07 DIAGNOSIS — R102 Pelvic and perineal pain unspecified side: Secondary | ICD-10-CM

## 2024-02-07 DIAGNOSIS — D219 Benign neoplasm of connective and other soft tissue, unspecified: Secondary | ICD-10-CM

## 2024-02-07 DIAGNOSIS — N946 Dysmenorrhea, unspecified: Secondary | ICD-10-CM

## 2024-02-07 DIAGNOSIS — N920 Excessive and frequent menstruation with regular cycle: Secondary | ICD-10-CM

## 2024-02-07 DIAGNOSIS — D259 Leiomyoma of uterus, unspecified: Secondary | ICD-10-CM | POA: Diagnosis not present

## 2024-02-07 DIAGNOSIS — Z7689 Persons encountering health services in other specified circumstances: Secondary | ICD-10-CM

## 2024-02-07 NOTE — Progress Notes (Signed)
 Patient presents today due to dysmenorrhea and recent CT. She states regularly timed cycles however they are very heavy and cramping is severe. Patient is not currently on birth control due to wanting to become pregnant at this time. She states no known history of fibroids in her past. No additional concerns.

## 2024-02-07 NOTE — Progress Notes (Signed)
 HPI:      [name] is a 33 y.o. G2P2 who LMP was Patient's last menstrual period was 01/23/2024 (approximate).  Subjective:   She presents today because she recently had pelvic pain and underwent a CT.  Multiple findings were seen on CT but the presence of a fibroid uterus was suggested. Patient has 8-day menses with significant dysmenorrhea and pelvic pain.  She has a history of iron deficiency anemia. She has been attempting pregnancy for 4 years without resultant pregnancy. She has normal regular monthly menses. She has had 2 prior cesarean deliveries.  She had chlamydia many years ago but was treated. Her partner has a youngest child of 39 years old. She recently underwent a colonoscopy and they found a polyp but nothing of any other significance.    Hx: The following portions of the patient's history were reviewed and updated as appropriate:             She  has a past medical history of Adrenal cyst (HCC), Anemia, Iron deficiency anemia, and Menorrhagia. She does not have any pertinent problems on file. She  has a past surgical history that includes Cesarean section; Colonoscopy with propofol (N/A, 01/31/2024); Esophagogastroduodenoscopy (egd) with propofol (N/A, 01/31/2024); biopsy (01/31/2024); and polypectomy (01/31/2024). Her family history is not on file. She  reports that she has been smoking cigarettes. She has never used smokeless tobacco. She reports current alcohol use of about 1.0 standard drink of alcohol per week. She reports that she does not use drugs. She has a current medication list which includes the following prescription(s): iron-vitamin c. She has no known allergies.       Review of Systems:  Review of Systems  Constitutional: Denied constitutional symptoms, night sweats, recent illness, fatigue, fever, insomnia and weight loss.  Eyes: Denied eye symptoms, eye pain, photophobia, vision change and visual disturbance.  Ears/Nose/Throat/Neck: Denied ear,  nose, throat or neck symptoms, hearing loss, nasal discharge, sinus congestion and sore throat.  Cardiovascular: Denied cardiovascular symptoms, arrhythmia, chest pain/pressure, edema, exercise intolerance, orthopnea and palpitations.  Respiratory: Denied pulmonary symptoms, asthma, pleuritic pain, productive sputum, cough, dyspnea and wheezing.  Gastrointestinal: Denied, gastro-esophageal reflux, melena, nausea and vomiting.  Genitourinary: Denied genitourinary symptoms including symptomatic vaginal discharge, pelvic relaxation issues, and urinary complaints.  Musculoskeletal: Denied musculoskeletal symptoms, stiffness, swelling, muscle weakness and myalgia.  Dermatologic: Denied dermatology symptoms, rash and scar.  Neurologic: Denied neurology symptoms, dizziness, headache, neck pain and syncope.  Psychiatric: Denied psychiatric symptoms, anxiety and depression.  Endocrine: Denied endocrine symptoms including hot flashes and night sweats.   Meds:   Current Outpatient Medications on File Prior to Visit  Medication Sig Dispense Refill   Iron-Vitamin C 100-250 MG TABS Take 1 tablet by mouth daily. 30 tablet 2   No current facility-administered medications on file prior to visit.      Objective:     Vitals:   02/07/24 1018 02/07/24 1043  BP: (!) 142/95 138/89  Pulse: 89    Filed Weights   02/07/24 1018  Weight: 251 lb 14.4 oz (114.3 kg)                        Assessment:    G2P2 Patient Active Problem List   Diagnosis Date Noted   Adenomatous polyp of colon 01/31/2024   Anemia    Menorrhagia    Iron deficiency anemia      1. Establishing care with new doctor, encounter for  2. Fibroid   3. Dysmenorrhea   4. Menorrhagia with regular cycle   5. Pelvic pain     Fibroids likely causing her pelvic pain and heavy bleeding resulting in anemia.  Possibly a cause of infertility.   Plan:            1.  Ultrasound delineate size and location of uterine  fibroids.  2.  Patient to consider her primary goal of childbirth versus control of dysmenorrhea and pelvic pain.   Fibroids natural course and history discussed in detail.  Management with hormones, UFE, surgery, hysterectomy and menopause discussed.  Questions answered. Orders Orders Placed This Encounter  Procedures   US PELVIC COMPLETE WITH TRANSVAGINAL    No orders of the defined types were placed in this encounter.     F/U  Return for We will contact her with any abnormal test results.  Elonda Husky, M.D. 02/07/2024 11:00 AM

## 2024-02-16 ENCOUNTER — Ambulatory Visit

## 2024-02-16 DIAGNOSIS — N946 Dysmenorrhea, unspecified: Secondary | ICD-10-CM | POA: Diagnosis not present

## 2024-02-16 DIAGNOSIS — N920 Excessive and frequent menstruation with regular cycle: Secondary | ICD-10-CM | POA: Diagnosis not present

## 2024-02-16 DIAGNOSIS — D259 Leiomyoma of uterus, unspecified: Secondary | ICD-10-CM | POA: Diagnosis not present

## 2024-02-16 DIAGNOSIS — R102 Pelvic and perineal pain: Secondary | ICD-10-CM | POA: Diagnosis not present

## 2024-02-16 DIAGNOSIS — D219 Benign neoplasm of connective and other soft tissue, unspecified: Secondary | ICD-10-CM

## 2024-02-22 NOTE — Progress Notes (Unsigned)
 Celso Amy, PA-C [address]  [address]  [place], [place] [address]  Main: [phone]  Fax: [phone]   Primary Care Physician: Pcp, No  Primary Gastroenterologist:  Celso Amy, PA-C / Dr. Wyline Mood    CC: F/U anemia, rectal bleeding, abdominal pain.  HPI: KORINA TRETTER is a 33 y.o. female returns for 6-week follow-up of abdominal pain, rectal bleeding, and iron deficiency anemia.  She has history of menorrhagia and uterine fibroids.  01/31/2024 EGD by Dr. Tobi Bastos: Normal.  Duodenal biopsies negative for celiac.  01/31/2024 colonoscopy: 1 small hyperplastic and 1 small adenomatous polyp removed.  Excellent prep.  Medium internal hemorrhoids.  Otherwise normal.  7-year repeat.  12/14/2023 labs: Showed mild anemia with hemoglobin 10.4.  Low iron.  Normal B12.  Normal CMP and CRP.  Pregnancy test negative.  She was recommended to start on ferrous sulfate 325 mg once daily.   12/19/2023 CT abdomen pelvis with contrast: Mild wall thickening of the transverse colon, nonspecific colitis.  Bladder wall thickening, possible cystitis (advised to follow-up with PCP).  Uterine fibroids (advised to follow-up with GYN).  Current Outpatient Medications  Medication Sig Dispense Refill   Iron-Vitamin C 100-250 MG TABS Take 1 tablet by mouth daily. 30 tablet 2   No current facility-administered medications for this visit.    Allergies as of 02/23/2024   (No Known Allergies)    Past Medical History:  Diagnosis Date   Adrenal cyst (HCC)    Anemia    Iron deficiency anemia    Menorrhagia     Past Surgical History:  Procedure Laterality Date   BIOPSY  01/31/2024   Procedure: BIOPSY;  Surgeon: Wyline Mood, MD;  Location: [facility] ENDOSCOPY;  Service: Gastroenterology;;   CESAREAN SECTION     COLONOSCOPY WITH PROPOFOL N/A 01/31/2024   Procedure: COLONOSCOPY WITH PROPOFOL;  Surgeon: Wyline Mood, MD;  Location: [facility] ENDOSCOPY;  Service: Gastroenterology;  Laterality: N/A;    ESOPHAGOGASTRODUODENOSCOPY (EGD) WITH PROPOFOL N/A 01/31/2024   Procedure: ESOPHAGOGASTRODUODENOSCOPY (EGD) WITH PROPOFOL;  Surgeon: Wyline Mood, MD;  Location: [facility] ENDOSCOPY;  Service: Gastroenterology;  Laterality: N/A;   POLYPECTOMY  01/31/2024   Procedure: POLYPECTOMY;  Surgeon: Wyline Mood, MD;  Location: [facility] ENDOSCOPY;  Service: Gastroenterology;;    Review of Systems:    All systems reviewed and negative except where noted in HPI.   Physical Examination:   LMP 01/23/2024 (Approximate)   General: Well-nourished, well-developed in no acute distress.  Lungs: Clear to auscultation bilaterally. Non-labored. Heart: Regular rate and rhythm, no murmurs rubs or gallops.  Abdomen: Bowel sounds are normal; Abdomen is Soft; No hepatosplenomegaly, masses or hernias;  No Abdominal Tenderness; No guarding or rebound tenderness. Neuro: Alert and oriented x 3.  Grossly intact.  Psych: Alert and cooperative, normal mood and affect.   Imaging Studies: No results found.  Assessment and Plan:   ELWANDA MOGER is a 33 y.o. y/o female returns for follow-up of rectal bleeding, and iron deficiency anemia.  History of menorrhagia and uterine fibroids.  Recent EGD was normal.  Biopsies negative for celiac.  Colonoscopy showed 1 small hyperplastic and 1 small adenomatous polyp removed.  7-year repeat.  There were medium internal hemorrhoids, probable source of rectal bleeding.  1.  Iron deficiency anemia  Lab: CBC, iron panel, ferritin  Reassurance regarding recent EGD and colonoscopy results.  Follow-up with GYN for menorrhagia.  Continue iron tablet daily.  2.  Medium internal hemorrhoids  Discussed treatment for hemorrhoids at length.  For External Hemorrhoids: Warm water sitz bath with epsom salt for flare up of external hemorrhoids. Use OTC Preparation H, Tucks Pads, and Witch Hazel wipes as needed. Discussed referral for Surgery as a last resort if Conservative treatment fails.  For  Internal Hemorrhoids: Rx Hydrocortisone Suppositories 25mg  Insert 1 into rectum once daily at bedtime for 10-14 days. Stressed importance of treating underlying constipation. Avoid Sitting on the toilet for prolonged amount of time. Discussed Internal Hemorrhoid Banding if no improvement with conservative treament.   3.  History of 1 small adenomatous colon polyp  7-year repeat colonoscopy will be due 01/2031.    Celso Amy, PA-C  Follow up ***  BP check ***

## 2024-02-23 ENCOUNTER — Ambulatory Visit (INDEPENDENT_AMBULATORY_CARE_PROVIDER_SITE_OTHER): Payer: 59 | Admitting: Physician Assistant

## 2024-02-23 ENCOUNTER — Encounter: Payer: Self-pay | Admitting: Physician Assistant

## 2024-02-23 VITALS — BP 143/79 | HR 87 | Temp 98.2°F | Ht 66.0 in | Wt 251.0 lb

## 2024-02-23 DIAGNOSIS — Z860101 Personal history of adenomatous and serrated colon polyps: Secondary | ICD-10-CM

## 2024-02-23 DIAGNOSIS — K648 Other hemorrhoids: Secondary | ICD-10-CM | POA: Diagnosis not present

## 2024-02-23 DIAGNOSIS — K644 Residual hemorrhoidal skin tags: Secondary | ICD-10-CM | POA: Diagnosis not present

## 2024-02-23 DIAGNOSIS — Z860102 Personal history of hyperplastic colon polyps: Secondary | ICD-10-CM

## 2024-02-23 DIAGNOSIS — K649 Unspecified hemorrhoids: Secondary | ICD-10-CM

## 2024-02-23 DIAGNOSIS — Z860100 Personal history of colon polyps, unspecified: Secondary | ICD-10-CM

## 2024-02-23 DIAGNOSIS — D509 Iron deficiency anemia, unspecified: Secondary | ICD-10-CM | POA: Diagnosis not present

## 2024-02-23 NOTE — Patient Instructions (Addendum)
 For External Hemorrhoids: Warm water sitz bath with epsom salt for flare up of external hemorrhoids. Use OTC Preparation H, Tucks Pads, and Witch Hazel wipes as needed.  For Internal Hemorrhoids: Rx Hydrocortisone Suppositories 25mg  Insert 1 into rectum once daily at bedtime for 10-14 days. Stressed importance of treating underlying constipation with OTC Miralax or Colace stool softener.  Avoid Sitting on the toilet for prolonged amount of time. Discussed Internal Hemorrhoid Banding if no improvement with conservative treament.   7-year repeat colonoscopy will be due 01/2031.     For constipation: Start OTC Miralax Powder Mix 1 capful in 6 to 8 ounces of a drink once daily  Recommend high-fiber diet, 30 g of fiber daily Eat fruits, vegetables, and whole grains Drink 64 ounces of water / fluids daily.

## 2024-02-24 ENCOUNTER — Encounter: Payer: Self-pay | Admitting: Physician Assistant

## 2024-02-24 LAB — CBC
Hematocrit: 37.4 % (ref 34.0–46.6)
Hemoglobin: 11.8 g/dL (ref 11.1–15.9)
MCH: 26.5 pg — ABNORMAL LOW (ref 26.6–33.0)
MCHC: 31.6 g/dL (ref 31.5–35.7)
MCV: 84 fL (ref 79–97)
Platelets: 297 10*3/uL (ref 150–450)
RBC: 4.45 x10E6/uL (ref 3.77–5.28)
RDW: 19.1 % — ABNORMAL HIGH (ref 11.7–15.4)
WBC: 7.7 10*3/uL (ref 3.4–10.8)

## 2024-02-24 LAB — IRON,TIBC AND FERRITIN PANEL
Ferritin: 18 ng/mL (ref 15–150)
Iron Saturation: 8 % — CL (ref 15–55)
Iron: 34 ug/dL (ref 27–159)
Total Iron Binding Capacity: 417 ug/dL (ref 250–450)
UIBC: 383 ug/dL (ref 131–425)
